# Patient Record
Sex: Female | Born: 1945
Health system: Southern US, Community
[De-identification: ages and names within clinical notes are randomized; demographics above are authoritative.]

## PROBLEM LIST (undated history)

## (undated) DIAGNOSIS — J449 Chronic obstructive pulmonary disease, unspecified: Secondary | ICD-10-CM

## (undated) DIAGNOSIS — I10 Essential (primary) hypertension: Secondary | ICD-10-CM

## (undated) DIAGNOSIS — R040 Epistaxis: Secondary | ICD-10-CM

## (undated) HISTORY — PX: BACK SURGERY: SHX140

## (undated) HISTORY — PX: ABDOMINAL HYSTERECTOMY: SHX81

---

## 2005-04-18 ENCOUNTER — Ambulatory Visit: Payer: Self-pay | Admitting: Internal Medicine

## 2005-06-04 ENCOUNTER — Ambulatory Visit: Payer: Self-pay | Admitting: Internal Medicine

## 2005-10-20 ENCOUNTER — Ambulatory Visit: Payer: Self-pay | Admitting: Internal Medicine

## 2006-07-30 ENCOUNTER — Ambulatory Visit: Payer: Self-pay | Admitting: Internal Medicine

## 2006-10-20 ENCOUNTER — Ambulatory Visit: Payer: Self-pay | Admitting: Internal Medicine

## 2007-08-24 ENCOUNTER — Ambulatory Visit: Payer: Self-pay | Admitting: Internal Medicine

## 2007-10-20 ENCOUNTER — Ambulatory Visit: Payer: Self-pay | Admitting: Internal Medicine

## 2008-08-25 ENCOUNTER — Ambulatory Visit: Payer: Self-pay | Admitting: Internal Medicine

## 2008-10-17 ENCOUNTER — Ambulatory Visit: Payer: Self-pay | Admitting: Internal Medicine

## 2009-08-06 ENCOUNTER — Ambulatory Visit: Payer: Self-pay | Admitting: Internal Medicine

## 2009-09-04 ENCOUNTER — Ambulatory Visit: Payer: Self-pay | Admitting: Internal Medicine

## 2009-10-17 ENCOUNTER — Ambulatory Visit: Payer: Self-pay | Admitting: Internal Medicine

## 2010-08-27 ENCOUNTER — Ambulatory Visit: Payer: Self-pay | Admitting: Internal Medicine

## 2010-09-17 ENCOUNTER — Ambulatory Visit: Payer: Self-pay | Admitting: Internal Medicine

## 2011-05-28 ENCOUNTER — Encounter: Payer: Self-pay | Admitting: Emergency Medicine

## 2011-05-28 ENCOUNTER — Emergency Department (HOSPITAL_COMMUNITY)
Admission: EM | Admit: 2011-05-28 | Discharge: 2011-05-28 | Disposition: A | Discharge status: Home / Self Care | Payer: 59 | Attending: Emergency Medicine | Admitting: Emergency Medicine

## 2011-05-28 DIAGNOSIS — Z79899 Other long term (current) drug therapy: Secondary | ICD-10-CM | POA: Insufficient documentation

## 2011-05-28 DIAGNOSIS — R040 Epistaxis: Secondary | ICD-10-CM | POA: Insufficient documentation

## 2011-05-28 DIAGNOSIS — J45909 Unspecified asthma, uncomplicated: Secondary | ICD-10-CM | POA: Insufficient documentation

## 2011-05-28 DIAGNOSIS — F17200 Nicotine dependence, unspecified, uncomplicated: Secondary | ICD-10-CM | POA: Insufficient documentation

## 2011-05-28 NOTE — ED Provider Notes (Signed)
History     CSN: [id number] Arrival date & time: 05/28/2011  5:08 AM   First MD Initiated Contact with Patient 05/28/11 [phone]      Chief Complaint  Patient presents with  . Epistaxis    (Consider location/radiation/quality/duration/timing/severity/associated sxs/prior treatment) Patient is a 65 y.o. female presenting with nosebleeds. The history is provided by the patient.  Epistaxis  This is a recurrent problem. The current episode started more than 2 days ago. The problem occurs rarely (He mostly has been occurring at night). The bleeding has been from the left nare. She has tried applying pressure for the symptoms. The treatment provided mild relief. Her past medical history does not include bleeding disorder, sinus problems or frequent nosebleeds.    Past Medical History  Diagnosis Date  . Asthma     Past Surgical History  Procedure Date  . Abdominal hysterectomy     History reviewed. No pertinent family history.  History  Substance Use Topics  . Smoking status: Current Everyday Smoker -- 1.0 packs/day    Types: Cigarettes  . Smokeless tobacco: Not on file  . Alcohol Use: 16.8 oz/week    28 Cans of beer per week    OB History    Grav Para Term Preterm Abortions TAB SAB Ect Mult Living                  Review of Systems  HENT: Positive for nosebleeds.   All other systems reviewed and are negative.    Allergies  Aspirin; Penicillins; and Sulfa antibiotics  Home Medications   Current Outpatient Rx  Name Route Sig Dispense Refill  . ALPRAZOLAM 0.25 MG PO TABS Oral Take 0.125 mg by mouth every morning. Once daily    . BECLOMETHASONE DIPROPIONATE 80 MCG/ACT IN AERS Inhalation Inhale 1 puff into the lungs as needed.      Marland Kitchen FLUTICASONE PROPIONATE 50 MCG/ACT NA SUSP Nasal Place 2 sprays into the nose daily.      Marland Kitchen FORMOTEROL FUMARATE 12 MCG IN CAPS Inhalation Place 12 mcg into inhaler and inhale daily.      Marland Kitchen HYPROMELLOSE 2.5 % OP SOLN  1 drop 3 (three) times  daily as needed. Dry eyes.     . IBUPROFEN 200 MG PO TABS Oral Take 200 mg by mouth every 6 (six) hours as needed. pain     . MONTELUKAST SODIUM 10 MG PO TABS Oral Take 10 mg by mouth at bedtime.        BP 187/90  Pulse 109  Temp(Src) 97.5 F (36.4 C) (Oral)  Resp 22  SpO2 93%  Physical Exam  Nursing note and vitals reviewed. Constitutional: She appears well-developed and well-nourished. No distress.  HENT:  Head: Normocephalic and atraumatic.  Right Ear: External ear normal.  Left Ear: External ear normal.  Nose: No nose lacerations, sinus tenderness, nasal deformity or septal deviation. Epistaxis is observed.       Pressure clot in the left nares, no obvious source of bleeding; right nostril normal  Eyes: Conjunctivae are normal. Right eye exhibits no discharge. Left eye exhibits no discharge. No scleral icterus.  Neck: Neck supple. No tracheal deviation present.  Cardiovascular: Normal rate.   Pulmonary/Chest: Effort normal. No stridor. No respiratory distress.  Musculoskeletal: She exhibits no edema.  Neurological: She is alert. Cranial nerve deficit: no gross deficits.  Skin: Skin is warm and dry. No rash noted.  Psychiatric: She has a normal mood and affect.    ED Course  EPISTAXIS MANAGEMENT Date/Time: 05/28/2011 5:52 AM Performed by: Linwood Dibbles R Authorized by: Linwood Dibbles R Consent: Verbal consent obtained. Risks and benefits: risks, benefits and alternatives were discussed Consent given by: patient Treatment site: left anterior Repair method: nasal balloon (5.5 cm Rhinostat) Treatment complexity: simple Patient tolerance: Patient tolerated the procedure well with no immediate complications.   (including critical care time)  Labs Reviewed - No data to display No results found.   1. Epistaxis       MDM  Patient with epistaxis. The bleeding was controlled with the nasal tampon. Patient tolerated procedure well. No further bleeding was  noted        Celene Kras, MD 05/28/11 [phone]

## 2011-05-28 NOTE — ED Notes (Signed)
Pt presents with nosebleed  Pt states it started 3 days ago and has been bleeding once a day mainly in the evening  Pt states this morning she got up to go to the restroom and it started bleeding  Pt has gauze in both nostrils at the present time

## 2011-05-28 NOTE — ED Notes (Signed)
MD at bedside. 

## 2011-05-28 NOTE — ED Notes (Signed)
Pt presents with nose bleed, pt placed paper towel in bilateral nares to stop the bleeding, MMM, bloody drainage noted in throat, tolerating secretions well, denies pain, cont to monitor

## 2011-05-28 NOTE — ED Notes (Signed)
Pt alert, c/o nose bleed, onset three days ago, worse this evening, resp even unlabored, skin pwd, tolerating oral secretions well, denies trauma or injury

## 2011-05-31 ENCOUNTER — Encounter (HOSPITAL_COMMUNITY): Payer: Self-pay

## 2011-05-31 ENCOUNTER — Emergency Department (HOSPITAL_COMMUNITY)
Admission: EM | Admit: 2011-05-31 | Discharge: 2011-05-31 | Disposition: A | Discharge status: Home / Self Care | Payer: 59 | Attending: Emergency Medicine | Admitting: Emergency Medicine

## 2011-05-31 DIAGNOSIS — I10 Essential (primary) hypertension: Secondary | ICD-10-CM | POA: Insufficient documentation

## 2011-05-31 DIAGNOSIS — J45909 Unspecified asthma, uncomplicated: Secondary | ICD-10-CM | POA: Insufficient documentation

## 2011-05-31 DIAGNOSIS — Z79899 Other long term (current) drug therapy: Secondary | ICD-10-CM | POA: Insufficient documentation

## 2011-05-31 DIAGNOSIS — F17200 Nicotine dependence, unspecified, uncomplicated: Secondary | ICD-10-CM | POA: Insufficient documentation

## 2011-05-31 HISTORY — DX: Epistaxis: R04.0

## 2011-05-31 MED ORDER — HYDROCHLOROTHIAZIDE 25 MG PO TABS: 12.5000 mg | ORAL_TABLET | Freq: Every day | ORAL | Status: DC

## 2011-05-31 NOTE — ED Provider Notes (Signed)
History     CSN: [id number] Arrival date & time: 05/31/2011  6:34 PM   First MD Initiated Contact with Patient 05/31/11 1921      Chief Complaint  Patient presents with  . Hypertension    (Consider location/radiation/quality/duration/timing/severity/associated sxs/prior treatment) Patient is a 65 y.o. female presenting with hypertension. The history is provided by the patient.  Hypertension   the patient reports developing a left nosebleed 4 days ago and since the recording high blood pressures at home.  She reports her blood pressure was high on arrival to the emergency department that day and since then she's continued to have high blood pressure readings at home.  She has no prior history of hypertension.  She reports her last followup appointment with her PCP was in February which time her blood pressure was normal.  She denies chest pain shortness of breath.  She denies current headache.  She denies upper lower external weakness.  Nothing worsens her symptoms.  Nothing improves her symptoms.  She is scheduled to have her left Rhino Rocket removed earlier this week..  She reports no more bleeding from her left neck  Past Medical History  Diagnosis Date  . Asthma   . Nosebleed     Past Surgical History  Procedure Date  . Abdominal hysterectomy     History reviewed. No pertinent family history.  History  Substance Use Topics  . Smoking status: Current Everyday Smoker -- 1.0 packs/day    Types: Cigarettes  . Smokeless tobacco: Not on file  . Alcohol Use: 16.8 oz/week    28 Cans of beer per week    OB History    Grav Para Term Preterm Abortions TAB SAB Ect Mult Living                  Review of Systems  All other systems reviewed and are negative.    Allergies  Aspirin; Penicillins; and Sulfa antibiotics  Home Medications   Current Outpatient Rx  Name Route Sig Dispense Refill  . ALPRAZOLAM 0.25 MG PO TABS Oral Take 0.125 mg by mouth every morning.     .  BECLOMETHASONE DIPROPIONATE 80 MCG/ACT IN AERS Inhalation Inhale 1 puff into the lungs daily.     Marland Kitchen FLUTICASONE PROPIONATE 50 MCG/ACT NA SUSP Nasal Place 2 sprays into the nose daily.      Marland Kitchen FORMOTEROL FUMARATE 12 MCG IN CAPS Inhalation Place 12 mcg into inhaler and inhale daily.      Marland Kitchen HYPROMELLOSE 2.5 % OP SOLN Both Eyes Place 1 drop into both eyes 3 (three) times daily as needed. For dry eyes.    . IBUPROFEN 200 MG PO TABS Oral Take 200 mg by mouth every 6 (six) hours as needed. For pain.    Marland Kitchen MONTELUKAST SODIUM 10 MG PO TABS Oral Take 10 mg by mouth at bedtime.      Marland Kitchen HYDROCHLOROTHIAZIDE 25 MG PO TABS Oral Take 0.5 tablets (12.5 mg total) by mouth daily. 30 tablet 0    BP 187/89  Pulse 85  Temp(Src) 97.9 F (36.6 C) (Oral)  Resp 16  Wt 144 lb (65.318 kg)  SpO2 96%  Physical Exam  Nursing note and vitals reviewed. Constitutional: She is oriented to person, place, and time. She appears well-developed and well-nourished. No distress.  HENT:  Head: Normocephalic and atraumatic.       Rhino Rocket and left there without surrounding bleeding  Eyes: EOM are normal.  Neck: Normal range of motion.  Cardiovascular: Normal rate, regular rhythm and normal heart sounds.   Pulmonary/Chest: Effort normal and breath sounds normal.  Abdominal: Soft. She exhibits no distension. There is no tenderness.  Musculoskeletal: Normal range of motion.  Neurological: She is alert and oriented to person, place, and time.  Skin: Skin is warm and dry.  Psychiatric: She has a normal mood and affect. Judgment normal.    ED Course  Procedures (including critical care time)  Labs Reviewed - No data to display No results found.   1. Hypertension       MDM  Isolated hypertension without symptoms.  The patient will be started on 12.5mg   daily hydrochlorothiazide.         Lyanne Co, MD 06/01/11 [phone]

## 2011-05-31 NOTE — ED Notes (Signed)
Seen here and treated for left nosebleed on Tuesday- pt was packed to left nare- Pt reports elevated BP no other symptoms at present- no active bleeding

## 2011-05-31 NOTE — ED Notes (Signed)
Patient stable upon discharge.  Patient states understanding of information md went over and dc papers.

## 2011-08-11 ENCOUNTER — Ambulatory Visit: Payer: Self-pay | Admitting: Internal Medicine

## 2011-09-01 ENCOUNTER — Ambulatory Visit: Payer: Self-pay | Admitting: Internal Medicine

## 2011-10-01 ENCOUNTER — Ambulatory Visit: Payer: Self-pay | Admitting: Internal Medicine

## 2011-11-27 ENCOUNTER — Encounter (HOSPITAL_COMMUNITY): Payer: Self-pay

## 2011-11-27 ENCOUNTER — Emergency Department (HOSPITAL_COMMUNITY): Payer: 59

## 2011-11-27 ENCOUNTER — Emergency Department (HOSPITAL_COMMUNITY)
Admission: EM | Admit: 2011-11-27 | Discharge: 2011-11-27 | Disposition: A | Discharge status: Home / Self Care | Payer: 59 | Attending: Emergency Medicine | Admitting: Emergency Medicine

## 2011-11-27 DIAGNOSIS — J45909 Unspecified asthma, uncomplicated: Secondary | ICD-10-CM | POA: Insufficient documentation

## 2011-11-27 DIAGNOSIS — R202 Paresthesia of skin: Secondary | ICD-10-CM

## 2011-11-27 DIAGNOSIS — R209 Unspecified disturbances of skin sensation: Secondary | ICD-10-CM | POA: Insufficient documentation

## 2011-11-27 DIAGNOSIS — F17200 Nicotine dependence, unspecified, uncomplicated: Secondary | ICD-10-CM | POA: Insufficient documentation

## 2011-11-27 DIAGNOSIS — I10 Essential (primary) hypertension: Secondary | ICD-10-CM | POA: Insufficient documentation

## 2011-11-27 DIAGNOSIS — I498 Other specified cardiac arrhythmias: Secondary | ICD-10-CM | POA: Insufficient documentation

## 2011-11-27 DIAGNOSIS — R42 Dizziness and giddiness: Secondary | ICD-10-CM | POA: Insufficient documentation

## 2011-11-27 DIAGNOSIS — J3489 Other specified disorders of nose and nasal sinuses: Secondary | ICD-10-CM | POA: Insufficient documentation

## 2011-11-27 HISTORY — DX: Essential (primary) hypertension: I10

## 2011-11-27 LAB — CBC
MCH: 34.7 pg — ABNORMAL HIGH (ref 26.0–34.0)
MCHC: 34.3 g/dL (ref 30.0–36.0)
MCV: 101.3 fL — ABNORMAL HIGH (ref 78.0–100.0)
Platelets: 291 10*3/uL (ref 150–400)
RBC: 4.78 MIL/uL (ref 3.87–5.11)

## 2011-11-27 LAB — POCT I-STAT, CHEM 8
BUN: 11 mg/dL (ref 6–23)
Calcium, Ion: 1.17 mmol/L (ref 1.12–1.32)
TCO2: 28 mmol/L (ref 0–100)

## 2011-11-27 LAB — POCT I-STAT TROPONIN I: Troponin i, poc: 0 ng/mL (ref 0.00–0.08)

## 2011-11-27 NOTE — ED Notes (Signed)
Patient reports that she has been having dizziness, feeling tired and night sweats since November 2012. Left arm numbness x 2 weeks. Patient was started on anti hypertension meds and dosages and meds  changed  Several times.

## 2011-11-27 NOTE — ED Provider Notes (Signed)
Medical screening examination/treatment/procedure(s) were performed by non-physician practitioner and as supervising physician I was immediately available for consultation/collaboration.   Dayton Bailiff, MD 11/27/11 1534

## 2011-11-27 NOTE — ED Provider Notes (Signed)
History     CSN: [id number]  Arrival date & time 11/27/11  1135   First MD Initiated Contact with Patient 11/27/11 1248      Chief Complaint  Patient presents with  . Dizziness  . Night Sweats    (Consider location/radiation/quality/duration/timing/severity/associated sxs/prior treatment) HPI Comments: Pt sis a 66 yo female who presents with complaints of dizziness, left arm tingling, weakness. States dizziness since she started blood pressure medications. States her doctor has changed her medications several times, each time her dizziness improves but recurs. States in the last two weeks left arm tingling sensation that comes and goes. Denies neck pain or injury. Denies left hand weakness. Nothing makes her symptom come on or go away.   The history is provided by the patient.    Past Medical History  Diagnosis Date  . Asthma   . Nosebleed   . Hypertension     Past Surgical History  Procedure Date  . Abdominal hysterectomy     Family History  Problem Relation Age of Onset  . Cancer Mother   . Diabetes Father     History  Substance Use Topics  . Smoking status: Current Everyday Smoker -- 1.0 packs/day    Types: Cigarettes  . Smokeless tobacco: Not on file  . Alcohol Use: 16.8 oz/week    28 Cans of beer per week    OB History    Grav Para Term Preterm Abortions TAB SAB Ect Mult Living                  Review of Systems  Constitutional: Negative for fever and chills.  HENT: Positive for congestion.   Respiratory: Negative for chest tightness and shortness of breath.   Cardiovascular: Negative for chest pain, palpitations and leg swelling.  Gastrointestinal: Negative for nausea and abdominal pain.  Genitourinary: Negative.   Musculoskeletal: Negative.   Skin: Negative.   Neurological: Positive for dizziness, weakness, light-headedness and numbness. Negative for headaches.    Allergies  Aspirin; Penicillins; and Sulfa antibiotics  Home Medications    Current Outpatient Rx  Name Route Sig Dispense Refill  . ALPRAZOLAM 0.25 MG PO TABS Oral Take 0.125 mg by mouth every morning.     . BECLOMETHASONE DIPROPIONATE 80 MCG/ACT IN AERS Inhalation Inhale 1 puff into the lungs daily.     Marland Kitchen BISOPROLOL FUMARATE 5 MG PO TABS Oral Take 5 mg by mouth daily.    Marland Kitchen ESOMEPRAZOLE MAGNESIUM 40 MG PO CPDR Oral Take 40 mg by mouth daily before breakfast.    . FLUTICASONE PROPIONATE 50 MCG/ACT NA SUSP Nasal Place 2 sprays into the nose daily.      Marland Kitchen FORMOTEROL FUMARATE 12 MCG IN CAPS Inhalation Place 12 mcg into inhaler and inhale daily.      Marland Kitchen HYPROMELLOSE 2.5 % OP SOLN Both Eyes Place 1 drop into both eyes 3 (three) times daily as needed. For dry eyes.    . IBUPROFEN 200 MG PO TABS Oral Take 200 mg by mouth every 6 (six) hours as needed. For pain.    Marland Kitchen MONTELUKAST SODIUM 10 MG PO TABS Oral Take 10 mg by mouth at bedtime.      Marland Kitchen TIOTROPIUM BROMIDE MONOHYDRATE 18 MCG IN CAPS Inhalation Place 18 mcg into inhaler and inhale daily.      BP 151/79  Pulse 69  Temp(Src) 98 F (36.7 C) (Oral)  Resp 20  Ht 5' 4.5" (1.638 m)  Wt 130 lb 4 oz (59.081 kg)  BMI  22.01 kg/m2  SpO2 98%  Physical Exam  Nursing note and vitals reviewed. Constitutional: She is oriented to person, place, and time. She appears well-developed and well-nourished. No distress.  HENT:  Head: Normocephalic.       Nasal congestion  Eyes: Conjunctivae are normal.  Neck: Normal range of motion. Neck supple.  Cardiovascular: Normal rate, regular rhythm and normal heart sounds.   Pulmonary/Chest: Effort normal and breath sounds normal. No respiratory distress. She has no wheezes.  Abdominal: Soft. Bowel sounds are normal. She exhibits no distension. There is no tenderness.  Musculoskeletal: Normal range of motion. She exhibits no edema.  Neurological: She is alert and oriented to person, place, and time. No cranial nerve deficit. Coordination normal.       Grip strength equal bilaterally.   Normal sensation over bilateral hands.   Skin: Skin is warm and dry.  Psychiatric: She has a normal mood and affect.    ED Course  Procedures (including critical care time) Pt in NAD. No current CP. No no dizziness currently. Pt does sound nasally congested, seeing allergist.  Possible inner ear vertigo? Pt worried about MI. Will get Labs, troponin, ecg, cxr   Date: 11/27/2011  Rate: 58  Rhythm: sinus bradycardia  QRS Axis: normal  Intervals: normal  ST/T Wave abnormalities: nonspecific T wave changes  Conduction Disutrbances:none  Narrative Interpretation:   Old EKG Reviewed: none available   Results for orders placed during the hospital encounter of 11/27/11  CBC      Component Value Range   WBC 8.9  4.0 - 10.5 (K/uL)   RBC 4.78  3.87 - 5.11 (MIL/uL)   Hemoglobin 16.6 (*) 12.0 - 15.0 (g/dL)   HCT 16.1 (*) 09.6 - 46.0 (%)   MCV 101.3 (*) 78.0 - 100.0 (fL)   MCH 34.7 (*) 26.0 - 34.0 (pg)   MCHC 34.3  30.0 - 36.0 (g/dL)   RDW 04.5  40.9 - 81.1 (%)   Platelets 291  150 - 400 (K/uL)  POCT I-STAT, CHEM 8      Component Value Range   Sodium 135  135 - 145 (mEq/L)   Potassium 3.9  3.5 - 5.1 (mEq/L)   Chloride 97  96 - 112 (mEq/L)   BUN 11  6 - 23 (mg/dL)   Creatinine, Ser 9.14  0.50 - 1.10 (mg/dL)   Glucose, Bld 782 (*) 70 - 99 (mg/dL)   Calcium, Ion 9.56  2.13 - 1.32 (mmol/L)   TCO2 28  0 - 100 (mmol/L)   Hemoglobin 18.0 (*) 12.0 - 15.0 (g/dL)   HCT 08.6 (*) 57.8 - 46.0 (%)  POCT I-STAT TROPONIN I      Component Value Range   Troponin i, poc 0.00  0.00 - 0.08 (ng/mL)   Comment 3             3:06 PM Pt's labs and CXR unremarkable. Troponin negative. Pt in no distress. Spulings test negative. No neuro deficits. Normal strength of extremities. VS normal.   Will d/c home.   1. Dizziness   2. Paresthesia of left arm       MDM          Lottie Mussel, PA 11/27/11 1508  Lottie Mussel, [place] 11/27/11 [phone]

## 2011-11-27 NOTE — ED Notes (Signed)
Dizziness, night sweats, left arm cold x 2 weeks.

## 2011-11-27 NOTE — Discharge Instructions (Signed)
Your lab work and x-ray did not show any results that would cause your symptoms. Do not check your blood pressure too often. Follow up with your doctor in the office for recheck, further evaluation and treatment. Return if worsening.   Dizziness Dizziness is a common problem. It is a feeling of unsteadiness or lightheadedness. You may feel like you are about to faint. Dizziness can lead to injury if you stumble or fall. A person of any age group can suffer from dizziness, but dizziness is more common in older adults. CAUSES  Dizziness can be caused by many different things, including:  Middle ear problems.   Standing for too long.   Infections.   An allergic reaction.   Aging.   An emotional response to something, such as the sight of blood.   Side effects of medicines.   Fatigue.   Problems with circulation or blood pressure.   Excess use of alcohol, medicines, or illegal drug use.   Breathing too fast (hyperventilation).   An arrhythmia or problems with your heart rhythm.   Low red blood cell count (anemia).   Pregnancy.   Vomiting, diarrhea, fever, or other illnesses that cause dehydration.   Diseases or conditions such as Parkinson's disease, high blood pressure (hypertension), diabetes, and thyroid problems.   Exposure to extreme heat.  DIAGNOSIS  To find the cause of your dizziness, your caregiver may do a physical exam, lab tests, radiologic imaging scans, or an electrocardiography test (ECG).  TREATMENT  Treatment of dizziness depends on the cause of your symptoms and can vary greatly. HOME CARE INSTRUCTIONS   Drink enough fluids to keep your urine clear or pale yellow. This is especially important in very hot weather. In the elderly, it is also important in cold weather.   If your dizziness is caused by medicines, take them exactly as directed. When taking blood pressure medicines, it is especially important to get up slowly.   Rise slowly from chairs and  steady yourself until you feel okay.   In the morning, first sit up on the side of the bed. When this seems okay, stand slowly while holding onto something until you know your balance is fine.   If you need to stand in one place for a long time, be sure to move your legs often. Tighten and relax the muscles in your legs while standing.   If dizziness continues to be a problem, have someone stay with you for a day or two. Do this until you feel you are well enough to stay alone. Have the person call your caregiver if he or she notices changes in you that are concerning.   Do not drive or use heavy machinery if you feel dizzy.  SEEK IMMEDIATE MEDICAL CARE IF:   Your dizziness or lightheadedness gets worse.   You feel nauseous or vomit.   You develop problems with talking, walking, weakness, or using your arms, hands, or legs.   You are not thinking clearly or you have difficulty forming sentences. It may take a friend or family member to determine if your thinking is normal.   You develop chest pain, abdominal pain, shortness of breath, or sweating.   Your vision changes.   You notice any bleeding.   You have side effects from medicine that seems to be getting worse rather than better.  MAKE SURE YOU:   Understand these instructions.   Will watch your condition.   Will get help right away if you are  not doing well or get worse.  Document Released: 12/17/2000 Document Revised: 06/12/2011 Document Reviewed: 01/10/2011 [facility] Patient Information 2012 [place], [place].  Hypertension As your heart beats, it forces blood through your arteries. This force is your blood pressure. If the pressure is too high, it is called hypertension (HTN) or high blood pressure. HTN is dangerous because you may have it and not know it. High blood pressure may mean that your heart has to work harder to pump blood. Your arteries may be narrow or stiff. The extra work puts you at risk for heart disease,  stroke, and other problems.  Blood pressure consists of two numbers, a higher number over a lower, 110/72, for example. It is stated as "110 over 72." The ideal is below 120 for the top number (systolic) and under 80 for the bottom (diastolic). Write down your blood pressure today. You should pay close attention to your blood pressure if you have certain conditions such as:  Heart failure.   Prior heart attack.   Diabetes   Chronic kidney disease.   Prior stroke.   Multiple risk factors for heart disease.  To see if you have HTN, your blood pressure should be measured while you are seated with your arm held at the level of the heart. It should be measured at least twice. A one-time elevated blood pressure reading (especially in the Emergency Department) does not mean that you need treatment. There may be conditions in which the blood pressure is different between your right and left arms. It is important to see your caregiver soon for a recheck. Most people have essential hypertension which means that there is not a specific cause. This type of high blood pressure may be lowered by changing lifestyle factors such as:  Stress.   Smoking.   Lack of exercise.   Excessive weight.   Drug/tobacco/alcohol use.   Eating less salt.  Most people do not have symptoms from high blood pressure until it has caused damage to the body. Effective treatment can often prevent, delay or reduce that damage. TREATMENT  When a cause has been identified, treatment for high blood pressure is directed at the cause. There are a large number of medications to treat HTN. These fall into several categories, and your caregiver will help you select the medicines that are best for you. Medications may have side effects. You should review side effects with your caregiver. If your blood pressure stays high after you have made lifestyle changes or started on medicines,   Your medication(s) may need to be changed.    Other problems may need to be addressed.   Be certain you understand your prescriptions, and know how and when to take your medicine.   Be sure to follow up with your caregiver within the time frame advised (usually within two weeks) to have your blood pressure rechecked and to review your medications.   If you are taking more than one medicine to lower your blood pressure, make sure you know how and at what times they should be taken. Taking two medicines at the same time can result in blood pressure that is too low.  SEEK IMMEDIATE MEDICAL CARE IF:  You develop a severe headache, blurred or changing vision, or confusion.   You have unusual weakness or numbness, or a faint feeling.   You have severe chest or abdominal pain, vomiting, or breathing problems.  MAKE SURE YOU:   Understand these instructions.   Will watch your condition.  Will get help right away if you are not doing well or get worse.  Document Released: 06/23/2005 Document Revised: 06/12/2011 Document Reviewed: 02/11/2008 [facility] Patient Information 2012 [place], [place].

## 2011-12-02 ENCOUNTER — Ambulatory Visit: Payer: Self-pay | Admitting: Specialist

## 2011-12-02 LAB — CREATININE, SERUM
EGFR (African American): >60
EGFR (Non-African Amer.): >60

## 2012-05-24 ENCOUNTER — Emergency Department (HOSPITAL_COMMUNITY): Payer: 59

## 2012-05-24 ENCOUNTER — Encounter (HOSPITAL_COMMUNITY): Payer: Self-pay | Admitting: *Deleted

## 2012-05-24 ENCOUNTER — Emergency Department (HOSPITAL_COMMUNITY)
Admission: EM | Admit: 2012-05-24 | Discharge: 2012-05-24 | Disposition: A | Discharge status: Home / Self Care | Payer: 59 | Attending: Emergency Medicine | Admitting: Emergency Medicine

## 2012-05-24 DIAGNOSIS — R0789 Other chest pain: Secondary | ICD-10-CM

## 2012-05-24 DIAGNOSIS — R05 Cough: Secondary | ICD-10-CM | POA: Insufficient documentation

## 2012-05-24 DIAGNOSIS — R059 Cough, unspecified: Secondary | ICD-10-CM | POA: Insufficient documentation

## 2012-05-24 DIAGNOSIS — Z79899 Other long term (current) drug therapy: Secondary | ICD-10-CM | POA: Insufficient documentation

## 2012-05-24 DIAGNOSIS — F17200 Nicotine dependence, unspecified, uncomplicated: Secondary | ICD-10-CM | POA: Insufficient documentation

## 2012-05-24 DIAGNOSIS — J4489 Other specified chronic obstructive pulmonary disease: Secondary | ICD-10-CM | POA: Insufficient documentation

## 2012-05-24 DIAGNOSIS — R071 Chest pain on breathing: Secondary | ICD-10-CM | POA: Insufficient documentation

## 2012-05-24 DIAGNOSIS — I10 Essential (primary) hypertension: Secondary | ICD-10-CM | POA: Insufficient documentation

## 2012-05-24 DIAGNOSIS — Z8669 Personal history of other diseases of the nervous system and sense organs: Secondary | ICD-10-CM | POA: Insufficient documentation

## 2012-05-24 DIAGNOSIS — J449 Chronic obstructive pulmonary disease, unspecified: Secondary | ICD-10-CM | POA: Insufficient documentation

## 2012-05-24 HISTORY — DX: Chronic obstructive pulmonary disease, unspecified: J44.9

## 2012-05-24 LAB — COMPREHENSIVE METABOLIC PANEL
Albumin: 4.4 g/dL (ref 3.5–5.2)
BUN: 7 mg/dL (ref 6–23)
Calcium: 9.7 mg/dL (ref 8.4–10.5)
Creatinine, Ser: 0.46 mg/dL — ABNORMAL LOW (ref 0.50–1.10)
Total Protein: 8.2 g/dL (ref 6.0–8.3)

## 2012-05-24 LAB — CBC WITH DIFFERENTIAL/PLATELET
Basophils Relative: 0 % (ref 0–1)
Eosinophils Absolute: 0 10*3/uL (ref 0.0–0.7)
HCT: 50.9 % — ABNORMAL HIGH (ref 36.0–46.0)
Hemoglobin: 18.2 g/dL — ABNORMAL HIGH (ref 12.0–15.0)
MCH: 34.9 pg — ABNORMAL HIGH (ref 26.0–34.0)
MCHC: 35.8 g/dL (ref 30.0–36.0)
Monocytes Absolute: 0.7 10*3/uL (ref 0.1–1.0)
Monocytes Relative: 9 % (ref 3–12)

## 2012-05-24 LAB — POCT I-STAT TROPONIN I: Troponin i, poc: 0 ng/mL (ref 0.00–0.08)

## 2012-05-24 MED ORDER — OXYCODONE-ACETAMINOPHEN 5-325 MG PO TABS: 1.0000 | ORAL_TABLET | Freq: Four times a day (QID) | ORAL | Status: DC | PRN

## 2012-05-24 MED ORDER — IBUPROFEN 200 MG PO TABS
600.0000 mg | ORAL_TABLET | Freq: Once | ORAL | Status: AC
  Administered 2012-05-24: 600 mg via ORAL
  Filled 2012-05-24: qty 3

## 2012-05-24 MED ORDER — IOHEXOL 350 MG/ML SOLN
100.0000 mL | Freq: Once | INTRAVENOUS | Status: AC | PRN
  Administered 2012-05-24: 100 mL via INTRAVENOUS

## 2012-05-24 MED ORDER — SODIUM CHLORIDE 0.9 % IV BOLUS (SEPSIS)
1000.0000 mL | Freq: Once | INTRAVENOUS | Status: AC
  Administered 2012-05-24: 1000 mL via INTRAVENOUS

## 2012-05-24 NOTE — ED Notes (Signed)
Pt reports chest pain since Friday, located in L breast/rib area. Describes as an ache. Sts pain worsens and moves into back when lying on side. Emesis x1 this am. Nausea intermittently. "little bit" of dizziness.

## 2012-05-24 NOTE — ED Notes (Signed)
Patient given discharge instructions, information, prescriptions, and diet order. Patient states that they adequately understand discharge information given and to return to ED if symptoms return or worsen.     

## 2012-05-24 NOTE — ED Provider Notes (Signed)
History     CSN: [id number]  Arrival date & time 05/24/12  1317   First MD Initiated Contact with Patient 05/24/12 1409      Chief Complaint  Patient presents with  . Chest Pain    (Consider location/radiation/quality/duration/timing/severity/associated sxs/prior treatment) Patient is a 66 y.o. female presenting with chest pain. The history is provided by the patient.  Chest Pain The chest pain began 3 - 5 days ago. Chest pain occurs constantly. The chest pain is unchanged. Associated with: worse with deep breathing or lying down on the left side. At its most intense, the pain is at 7/10. The pain is currently at 7/10. The severity of the pain is moderate. The quality of the pain is described as pleuritic and sharp. The pain does not radiate. Primary symptoms include cough. Pertinent negatives for primary symptoms include no shortness of breath, no wheezing, no palpitations, no nausea and no vomiting.  Pertinent negatives for associated symptoms include no diaphoresis. She tried nothing for the symptoms.  Her past medical history is significant for COPD and hypertension.  Pertinent negatives for past medical history include no CAD, no diabetes, no hyperlipidemia and no MI.  Pertinent negatives for family medical history include: no CAD in family.     Past Medical History  Diagnosis Date  . Asthma   . Nosebleed   . Hypertension   . COPD (chronic obstructive pulmonary disease)     Past Surgical History  Procedure Date  . Abdominal hysterectomy     Family History  Problem Relation Age of Onset  . Cancer Mother   . Diabetes Father     History  Substance Use Topics  . Smoking status: Current Every Day Smoker -- 1.0 packs/day    Types: Cigarettes  . Smokeless tobacco: Not on file  . Alcohol Use: 16.8 oz/week    28 Cans of beer per week     Comment: "at least of couple of beers every day"    OB History    Grav Para Term Preterm Abortions TAB SAB Ect Mult Living              Review of Systems  Constitutional: Negative for diaphoresis.  Respiratory: Positive for cough. Negative for shortness of breath and wheezing.   Cardiovascular: Positive for chest pain. Negative for palpitations.  Gastrointestinal: Negative for nausea and vomiting.  All other systems reviewed and are negative.    Allergies  Aspirin; Penicillins; and Sulfa antibiotics  Home Medications   Current Outpatient Rx  Name  Route  Sig  Dispense  Refill  . ALBUTEROL SULFATE HFA 108 (90 BASE) MCG/ACT IN AERS   Inhalation   Inhale 2 puffs into the lungs every 6 (six) hours as needed. For shortness of breath.         . ALPRAZOLAM 0.25 MG PO TABS   Oral   Take 0.125 mg by mouth every morning.          . BECLOMETHASONE DIPROPIONATE 80 MCG/ACT IN AERS   Inhalation   Inhale 1 puff into the lungs 2 (two) times daily.          Marland Kitchen BISOPROLOL FUMARATE 5 MG PO TABS   Oral   Take 2.5 mg by mouth daily.          Marland Kitchen ESOMEPRAZOLE MAGNESIUM 40 MG PO CPDR   Oral   Take 40 mg by mouth every morning.          Marland Kitchen FLUTICASONE PROPIONATE 50 MCG/ACT  NA SUSP   Nasal   Place 2 sprays into the nose daily as needed. For nasal congestion.         Marland Kitchen FORMOTEROL FUMARATE 12 MCG IN CAPS   Inhalation   Place 12 mcg into inhaler and inhale 2 (two) times daily.          . IBUPROFEN 200 MG PO TABS   Oral   Take 200-400 mg by mouth every 6 (six) hours as needed. For pain.         Marland Kitchen MONTELUKAST SODIUM 10 MG PO TABS   Oral   Take 10 mg by mouth at bedtime.           . ADULT MULTIVITAMIN W/MINERALS CH   Oral   Take 1 tablet by mouth daily. One-A-Day Chewable         . SALINE NASAL SPRAY 0.65 % NA SOLN   Nasal   Place 1 spray into the nose as needed. For nasal congestion.         Marland Kitchen TIOTROPIUM BROMIDE MONOHYDRATE 18 MCG IN CAPS   Inhalation   Place 18 mcg into inhaler and inhale daily.           BP 150/83  Temp 98.1 F (36.7 C) (Oral)  SpO2 93%  Physical Exam    Nursing note and vitals reviewed. Constitutional: She is oriented to person, place, and time. She appears well-developed and well-nourished. No distress.  HENT:  Head: Normocephalic and atraumatic.  Mouth/Throat: Oropharynx is clear and moist.  Eyes: Conjunctivae normal and EOM are normal. Pupils are equal, round, and reactive to light.  Neck: Normal range of motion. Neck supple.  Cardiovascular: Normal rate, regular rhythm and intact distal pulses.   No murmur heard. Pulmonary/Chest: Effort normal and breath sounds normal. No respiratory distress. She has no wheezes. She has no rales.   She exhibits tenderness.  Abdominal: Soft. She exhibits no distension. There is no tenderness. There is no rebound and no guarding.  Musculoskeletal: Normal range of motion. She exhibits no edema and no tenderness.  Neurological: She is alert and oriented to person, place, and time.  Skin: Skin is warm and dry. No rash noted. No erythema.  Psychiatric: She has a normal mood and affect. Her behavior is normal.    ED Course  Procedures (including critical care time)  Labs Reviewed  CBC WITH DIFFERENTIAL - Abnormal; Notable for the following:    RBC 5.22 (*)     Hemoglobin 18.2 (*)     HCT 50.9 (*)     MCH 34.9 (*)     All other components within normal limits  COMPREHENSIVE METABOLIC PANEL - Abnormal; Notable for the following:    Sodium 134 (*)     Chloride 95 (*)     Glucose, Bld 112 (*)     Creatinine, Ser 0.46 (*)     Alkaline Phosphatase 131 (*)     All other components within normal limits  POCT I-STAT TROPONIN I   Dg Chest 2 View  05/24/2012  *RADIOLOGY REPORT*  Clinical Data: Chest pain, cough  CHEST - 2 VIEW  Comparison: 11/27/2011  Findings: Small peripheral right upper lobe calcified granuloma noted, stable. Additional calcified granuloma in the left costophrenic angle.  Normal heart size and vascularity.  Negative for pneumonia, edema, collapse, consolidation, effusion, or  pneumothorax.  Trachea midline.  IMPRESSION: No acute chest process.  Stable granulomatous disease.   Original Report Authenticated By: Judie Petit. Miles Costain, M.D.  Ct Angio Chest Pe W/cm &/or Wo Cm  05/24/2012  *RADIOLOGY REPORT*  Clinical Data: Chest pain, shortness of breath.  CT ANGIOGRAPHY CHEST  Technique:  Multidetector CT imaging of the chest using the standard protocol during bolus administration of intravenous contrast. Multiplanar reconstructed images including MIPs were obtained and reviewed to evaluate the vascular anatomy.  Contrast: OMNIPAQUE IOHEXOL 350 MG/ML SOLN  Comparison: None.  Findings: There is good contrast opacification of the pulmonary artery branches.  No discrete filling defect to suggest acute PE.Adequate contrast opacification of the thoracic aorta with no evidence of dissection, aneurysm, or stenosis. There is classic 3- vessel brachiocephalic arch anatomy.  Patchy coronary and aortic calcified plaque.  No hilar or mediastinal adenopathy.  No pleural or pericardial effusion.  Mild emphysematous changes noted in the upper lobes.  Calcified granuloma in the superior segment right lower lobe.  Calcified right hilar lymph nodes.  Thoracic spine and sternum intact.  IMPRESSION: 1.  Negative for acute PE or thoracic aortic dissection. 2.  Atherosclerosis, including thoracic aortic and coronary artery disease. Please note that although the presence of coronary artery calcium documents the presence of coronary artery disease, the severity of this disease and any potential stenosis cannot be assessed on this non-gated CT examination.  Assessment for potential risk factor modification, dietary therapy or pharmacologic therapy may be warranted, if clinically indicated. 3.  Old granulomatous disease.   Original Report Authenticated By: D. Andria Rhein, MD      Date: 05/24/2012  Rate: 97  Rhythm: normal sinus rhythm  QRS Axis: right  Intervals: normal  ST/T Wave abnormalities: normal   Conduction Disutrbances:none  Narrative Interpretation:   Old EKG Reviewed: unchanged   No diagnosis found.    MDM  Pt with atypical story for CP.  TIMI 0 and only risk factor was smoking and HTN.  Pt's sx have been ongoing for the last 3 days without improvement and neg EKG and normal troponin today. Low suspicion for cardiac etiology.  No findings concerning for pericarditis.  Concern for possible PE given pleuritic sx.  CXR, CBC, BMP, CE  Are neg.  CTA of the chest pending.  4:01 PM CT of the chest neg for PE.  Feel this is most likely pleurisy.  No other acute findings currently.  Will d/c home with pain meds and f/u with PCP.       Gwyneth Sprout, MD 05/24/12 [phone]

## 2012-07-16 ENCOUNTER — Ambulatory Visit: Payer: Self-pay | Admitting: Internal Medicine

## 2012-07-22 ENCOUNTER — Ambulatory Visit: Payer: Self-pay | Admitting: Orthopedic Surgery

## 2012-07-23 LAB — PATHOLOGY REPORT

## 2012-11-16 ENCOUNTER — Ambulatory Visit: Payer: Self-pay | Admitting: Internal Medicine

## 2013-11-30 ENCOUNTER — Other Ambulatory Visit: Payer: Self-pay

## 2013-11-30 DIAGNOSIS — Z1231 Encounter for screening mammogram for malignant neoplasm of breast: Secondary | ICD-10-CM

## 2013-12-16 ENCOUNTER — Ambulatory Visit
Admission: RE | Admit: 2013-12-16 | Discharge: 2013-12-16 | Disposition: A | Discharge status: Home / Self Care | Payer: Medicare Other | Source: Ambulatory Visit

## 2013-12-16 DIAGNOSIS — Z1231 Encounter for screening mammogram for malignant neoplasm of breast: Secondary | ICD-10-CM

## 2014-08-23 ENCOUNTER — Other Ambulatory Visit: Payer: Self-pay | Admitting: Plastic Surgery

## 2014-08-23 DIAGNOSIS — C069 Malignant neoplasm of mouth, unspecified: Secondary | ICD-10-CM

## 2014-08-24 ENCOUNTER — Ambulatory Visit
Admission: RE | Admit: 2014-08-24 | Discharge: 2014-08-24 | Disposition: A | Discharge status: Home / Self Care | Payer: Medicare Other | Source: Ambulatory Visit | Attending: Plastic Surgery | Admitting: Plastic Surgery

## 2014-08-24 DIAGNOSIS — C069 Malignant neoplasm of mouth, unspecified: Secondary | ICD-10-CM

## 2014-08-24 MED ORDER — IOHEXOL 300 MG/ML  SOLN
75.0000 mL | Freq: Once | INTRAMUSCULAR | Status: AC | PRN
  Administered 2014-08-24: 75 mL via INTRAVENOUS

## 2014-10-27 NOTE — Op Note (Signed)
PATIENT NAME:  Deanna Burns, Deanna Burns MR#:  [id number] DATE OF BIRTH:  31-Dec-1945  DATE OF PROCEDURE:  07/22/2012  PREOPERATIVE DIAGNOSIS: T5 and T6 compression fractures.   POSTOPERATIVE DIAGNOSIS:  T5 and T6 compression fractures.   PROCEDURE: T5 and C6 a vertebral biopsy and kyphoplasty.   SURGEON: Hessie Knows, M.D.   ANESTHESIA: General.    DESCRIPTION OF PROCEDURE: Patient was brought to the operating room and after adequate anesthesia was obtained with sedation with the patient in a prone position, C-arm was brought in and good visualization of the affected levels could be obtained. A timeout  procedure was carried out and 20 mL of 1% Xylocaine was infiltrated into the area of the planned skin incisions. Next, the back was prepped and draped in the usual sterile fashion and a repeat timeout procedure completed. At this point a spinal needle was placed down to the appropriate level  over the rib and at the T5 level and T6 level on the right and left sides, with a mixture of Xylocaine and Marcaine being placed. After this had been completed, the T5 was approached first with a small stab incision, trocar introduced extra particularly and with care being taken to visualize this as it passed into the vertebral body in both AP and lateral projections. Biopsy was obtained with a good bone biopsy obtained. It did not appear pathologic. Next, the balloon was inserted and inflated approximately 1.5 mL of contrast,  elevating the fracture. The left side was addressed in the same fashion, except for no bone biopsy being obtained. At this point, the T6 level was addressed, making a stab incision on the right side. The trocar was advanced through the pedicle and actually into more of a center position and it was felt after that inflating the balloon only one level would be necessary for correction. The balloons were deflated at this time and the cement mixed and inserted through the cannulas, with care being taken to  make sure it did not migrate posteriorly or anteriorly. It did appear to stay within the vertebral bodies. After this had set adequately, all the trocars were removed. The wounds were cut closed with Dermabond and Band-Aids. A biopsy was also obtained of the T6 level after initially entering into it with a good bone biopsy obtained. Again, they did not appear to be pathologic.   ESTIMATED BLOOD LOSS: 25 mL.   COMPLICATIONS: None.   SPECIMEN: T5 and T6 vertebral body biopsies.    ____________________________ Laurene Footman, MD mjm:cc D: 07/22/2012 22:01:33 ET T: 07/22/2012 22:25:44 ET JOB#: [id number]  cc: Laurene Footman, MD, <Dictator> Laurene Footman MD ELECTRONICALLY SIGNED 07/23/2012 13:05

## 2014-11-15 ENCOUNTER — Other Ambulatory Visit: Payer: Self-pay

## 2014-11-15 DIAGNOSIS — Z1231 Encounter for screening mammogram for malignant neoplasm of breast: Secondary | ICD-10-CM

## 2014-12-12 ENCOUNTER — Other Ambulatory Visit: Payer: Self-pay | Admitting: Surgery

## 2014-12-12 DIAGNOSIS — M25512 Pain in left shoulder: Secondary | ICD-10-CM

## 2014-12-12 DIAGNOSIS — M7582 Other shoulder lesions, left shoulder: Secondary | ICD-10-CM

## 2014-12-20 ENCOUNTER — Ambulatory Visit
Admission: RE | Admit: 2014-12-20 | Discharge: 2014-12-20 | Disposition: A | Discharge status: Home / Self Care | Payer: Medicare Other | Source: Ambulatory Visit

## 2014-12-20 DIAGNOSIS — Z1231 Encounter for screening mammogram for malignant neoplasm of breast: Secondary | ICD-10-CM

## 2017-02-24 ENCOUNTER — Emergency Department (HOSPITAL_COMMUNITY)
Admission: EM | Admit: 2017-02-24 | Discharge: 2017-02-24 | Disposition: A | Discharge status: Home / Self Care | Payer: Medicare Other | Attending: Emergency Medicine | Admitting: Emergency Medicine

## 2017-02-24 ENCOUNTER — Encounter (HOSPITAL_COMMUNITY): Payer: Self-pay | Admitting: Emergency Medicine

## 2017-02-24 DIAGNOSIS — Z5321 Procedure and treatment not carried out due to patient leaving prior to being seen by health care provider: Secondary | ICD-10-CM | POA: Insufficient documentation

## 2017-02-24 LAB — COMPREHENSIVE METABOLIC PANEL
ALBUMIN: 4 g/dL (ref 3.5–5.0)
ALT: 13 U/L — ABNORMAL LOW (ref 14–54)
ANION GAP: 8 (ref 5–15)
AST: 17 U/L (ref 15–41)
Alkaline Phosphatase: 101 U/L (ref 38–126)
BUN: 12 mg/dL (ref 6–20)
CO2: 28 mmol/L (ref 22–32)
Calcium: 8.8 mg/dL — ABNORMAL LOW (ref 8.9–10.3)
Chloride: 92 mmol/L — ABNORMAL LOW (ref 101–111)
Creatinine, Ser: 0.71 mg/dL (ref 0.44–1.00)
GFR calc Af Amer: >60 mL/min (ref >60)
GFR calc non Af Amer: >60 mL/min (ref >60)
GLUCOSE: 124 mg/dL — AB (ref 65–99)
POTASSIUM: 4.4 mmol/L (ref 3.5–5.1)
SODIUM: 128 mmol/L — AB (ref 135–145)
Total Bilirubin: 1 mg/dL (ref 0.3–1.2)
Total Protein: 7.7 g/dL (ref 6.5–8.1)

## 2017-02-24 LAB — CBC
HCT: 47.3 % — ABNORMAL HIGH (ref 36.0–46.0)
HEMOGLOBIN: 16.4 g/dL — AB (ref 12.0–15.0)
MCH: 34 pg (ref 26.0–34.0)
MCHC: 34.7 g/dL (ref 30.0–36.0)
MCV: 98.1 fL (ref 78.0–100.0)
Platelets: 263 10*3/uL (ref 150–400)
RBC: 4.82 MIL/uL (ref 3.87–5.11)
RDW: 13.3 % (ref 11.5–15.5)
WBC: 10.2 10*3/uL (ref 4.0–10.5)

## 2017-02-24 LAB — LIPASE, BLOOD: LIPASE: 66 U/L — AB (ref 11–51)

## 2017-02-24 NOTE — ED Notes (Signed)
Pt gave registration her stickers and she left.

## 2017-02-24 NOTE — ED Triage Notes (Signed)
Patient c/o abd pain x 5 days. Patient took mag citrate yesterday to help have BM to see if would help with abd pain and gas pains. Patient reports that had "spurts of Bms that have been liquid and formed".  Patient reports nausea and loss of appetite.

## 2017-02-25 ENCOUNTER — Emergency Department (HOSPITAL_BASED_OUTPATIENT_CLINIC_OR_DEPARTMENT_OTHER): Payer: Medicare Other

## 2017-02-25 ENCOUNTER — Emergency Department (HOSPITAL_BASED_OUTPATIENT_CLINIC_OR_DEPARTMENT_OTHER)
Admission: EM | Admit: 2017-02-25 | Discharge: 2017-02-25 | Disposition: A | Discharge status: Home / Self Care | Payer: Medicare Other | Attending: Emergency Medicine | Admitting: Emergency Medicine

## 2017-02-25 ENCOUNTER — Encounter (HOSPITAL_BASED_OUTPATIENT_CLINIC_OR_DEPARTMENT_OTHER): Payer: Self-pay

## 2017-02-25 DIAGNOSIS — F17210 Nicotine dependence, cigarettes, uncomplicated: Secondary | ICD-10-CM | POA: Diagnosis not present

## 2017-02-25 DIAGNOSIS — I10 Essential (primary) hypertension: Secondary | ICD-10-CM | POA: Diagnosis not present

## 2017-02-25 DIAGNOSIS — J45909 Unspecified asthma, uncomplicated: Secondary | ICD-10-CM | POA: Diagnosis not present

## 2017-02-25 DIAGNOSIS — R1030 Lower abdominal pain, unspecified: Secondary | ICD-10-CM | POA: Diagnosis present

## 2017-02-25 DIAGNOSIS — K5792 Diverticulitis of intestine, part unspecified, without perforation or abscess without bleeding: Secondary | ICD-10-CM

## 2017-02-25 DIAGNOSIS — J449 Chronic obstructive pulmonary disease, unspecified: Secondary | ICD-10-CM | POA: Diagnosis not present

## 2017-02-25 DIAGNOSIS — K5732 Diverticulitis of large intestine without perforation or abscess without bleeding: Secondary | ICD-10-CM | POA: Insufficient documentation

## 2017-02-25 MED ORDER — CIPROFLOXACIN HCL 500 MG PO TABS: 500.0000 mg | ORAL_TABLET | Freq: Two times a day (BID) | ORAL | 0 refills | Status: DC

## 2017-02-25 MED ORDER — SODIUM CHLORIDE 0.9 % IV BOLUS (SEPSIS)
1000.0000 mL | Freq: Once | INTRAVENOUS | Status: AC
  Administered 2017-02-25: 1000 mL via INTRAVENOUS

## 2017-02-25 MED ORDER — METRONIDAZOLE 500 MG PO TABS: 500.0000 mg | ORAL_TABLET | Freq: Two times a day (BID) | ORAL | 0 refills | Status: DC

## 2017-02-25 MED ORDER — ONDANSETRON HCL 4 MG/2ML IJ SOLN
4.0000 mg | Freq: Once | INTRAMUSCULAR | Status: AC
  Administered 2017-02-25: 4 mg via INTRAVENOUS
  Filled 2017-02-25: qty 2

## 2017-02-25 MED ORDER — IOPAMIDOL (ISOVUE-300) INJECTION 61%
100.0000 mL | Freq: Once | INTRAVENOUS | Status: AC | PRN
  Administered 2017-02-25: 100 mL via INTRAVENOUS

## 2017-02-25 MED FILL — metroNIDAZOLE 500 MG TABS: 500 | 7 days supply | Qty: 14 | Fill #0

## 2017-02-25 MED FILL — CIPROFLOXACIN HCL 500 MG TA: 500 | 7 days supply | Qty: 14 | Fill #0

## 2017-02-25 NOTE — ED Notes (Signed)
Attempted IV to bil AC, tol well.

## 2017-02-25 NOTE — ED Triage Notes (Signed)
C/o constipation x 1 week-can not recall last normal BM-NAD-steady gait

## 2017-02-25 NOTE — Discharge Instructions (Signed)
Follow up with your PCP.  You had a nodule noted in your right lower lung.  This needs further imaging.  Please discuss with your family doc.

## 2017-02-25 NOTE — ED Provider Notes (Signed)
[place] [facility] Provider Note   CSN: [id number] Arrival date & time: 02/25/17  1324     History   Chief Complaint Chief Complaint  Patient presents with  . Constipation    HPI KAYTLYN DIN is a 71 y.o. female.  71 yo F with a chief complaint of lower abdominal pain. She also has been constipated and doesn't know when her last bowel movement was. Patient has a chronic issue with constipation and takes MiraLAX every day. She's never had an issue like this though. She took magnesium sulfate without improvement. Feels that she has a pressure in her rectum but is unable to pass any stool. Denies fevers or chills. Has had nausea but denies vomiting. Patient has had a hysterectomy but denies other abdominal surgery. She is concerned that she has a small bowel obstruction.   The history is provided by the patient and the spouse.  Constipation   This is a new problem. The current episode started more than 1 week ago. Associated symptoms include abdominal pain. Pertinent negatives include no dysuria. She has tried stimulants and osmotic agents for the symptoms. The treatment provided no relief.  Illness  This is a recurrent problem. The current episode started more than 1 week ago. The problem occurs constantly. The problem has not changed since onset.Associated symptoms include abdominal pain. Pertinent negatives include no chest pain, no headaches and no shortness of breath. Nothing aggravates the symptoms. Nothing relieves the symptoms. Treatments tried: laxatives. The treatment provided no relief.    Past Medical History:  Diagnosis Date  . Asthma   . COPD (chronic obstructive pulmonary disease) ([facility])   . Hypertension   . Nosebleed     There are no active problems to display for this patient.   Past Surgical History:  Procedure Laterality Date  . ABDOMINAL HYSTERECTOMY    . BACK SURGERY      OB History    No data available       Home Medications    Prior to  Admission medications   Medication Sig Start Date End Date Taking? Authorizing Provider  ALENDRONATE SODIUM PO Take by mouth.   Yes [provider]  Lifitegrast Shirley Friar OP) Apply to eye.   Yes [provider]  RaNITidine HCl (ZANTAC PO) Take by mouth.   Yes [provider]  Umeclidinium-Vilanterol (ANORO ELLIPTA IN) Inhale into the lungs.   Yes [provider]  albuterol (PROVENTIL HFA;VENTOLIN HFA) 108 (90 BASE) MCG/ACT inhaler Inhale 2 puffs into the lungs every 6 (six) hours as needed. For shortness of breath.    [provider]  ALPRAZolam Duanne Moron) 0.25 MG tablet Take 0.125 mg by mouth every morning.     [provider]  bisoprolol (ZEBETA) 5 MG tablet Take 2.5 mg by mouth daily.     [provider]  ciprofloxacin (CIPRO) 500 MG tablet Take 1 tablet (500 mg total) by mouth 2 (two) times daily. One po bid x 7 days 02/25/17   Deno Etienne, DO  ibuprofen (ADVIL,MOTRIN) 200 MG tablet Take 200-400 mg by mouth every 6 (six) hours as needed. For pain.    [provider]  metroNIDAZOLE (FLAGYL) 500 MG tablet Take 1 tablet (500 mg total) by mouth 2 (two) times daily. One po bid x 7 days 02/25/17   Deno Etienne, DO  montelukast (SINGULAIR) 10 MG tablet Take 10 mg by mouth at bedtime.      [provider]    Family History Family  History  Problem Relation Age of Onset  . Cancer Mother   . Diabetes Father     Social History Social History  Substance Use Topics  . Smoking status: Current Every Day Smoker    Packs/day: 1.00    Types: Cigarettes  . Smokeless tobacco: Never Used  . Alcohol use 16.8 oz/week    28 Cans of beer per week     Comment: "at least of couple of beers every day"     Allergies   Aspirin; Latex; Penicillins; and Sulfa antibiotics   Review of Systems Review of Systems  Constitutional: Negative for chills and fever.  HENT: Negative for congestion and rhinorrhea.   Eyes: Negative for redness  and visual disturbance.  Respiratory: Negative for shortness of breath and wheezing.   Cardiovascular: Negative for chest pain and palpitations.  Gastrointestinal: Positive for abdominal pain, constipation and nausea. Negative for vomiting.  Genitourinary: Negative for dysuria and urgency.  Musculoskeletal: Negative for arthralgias and myalgias.  Skin: Negative for pallor and wound.  Neurological: Negative for dizziness and headaches.     Physical Exam Updated Vital Signs BP (!) 131/58 (BP Location: Right Arm)   Pulse 75   Temp 97.9 F (36.6 C) (Oral)   Resp 18   Ht 5\' 2"  (1.575 m)   Wt 63.5 kg (140 lb)   SpO2 97%   BMI 25.61 kg/m   Physical Exam  Constitutional: She is oriented to person, place, and time. She appears well-developed and well-nourished. No distress.  HENT:  Head: Normocephalic and atraumatic.  Eyes: Pupils are equal, round, and reactive to light. EOM are normal.  Neck: Normal range of motion. Neck supple.  Cardiovascular: Normal rate and regular rhythm.  Exam reveals no gallop and no friction rub.   No murmur heard. Pulmonary/Chest: Effort normal. She has no wheezes. She has no rales.  Abdominal: Soft. She exhibits no distension and no mass. There is no tenderness. There is no guarding.  Musculoskeletal: She exhibits no edema or tenderness.  Neurological: She is alert and oriented to person, place, and time.  Skin: Skin is warm and dry. She is not diaphoretic.  Psychiatric: She has a normal mood and affect. Her behavior is normal.  Nursing note and vitals reviewed.    ED Treatments / Results  Labs (all labs ordered are listed, but only abnormal results are displayed) Labs Reviewed - No data to display  EKG  EKG Interpretation None       Radiology Ct Abdomen Pelvis W Contrast  Result Date: 02/25/2017 CLINICAL DATA:  Lower abdominal pain for several days EXAM: CT ABDOMEN AND PELVIS WITH CONTRAST TECHNIQUE: Multidetector CT imaging of the abdomen  and pelvis was performed using the standard protocol following bolus administration of intravenous contrast. CONTRAST:  139mL ISOVUE-300 IOPAMIDOL (ISOVUE-300) INJECTION 61% COMPARISON:  08/24/2014 FINDINGS: Lower chest: Lung bases are well aerated. There is a somewhat nodular appearing density in the right lower lobe which was not present on the prior exam. It measures approximately 11 mm in short axis. Hepatobiliary: Multiple gallstones are noted. The liver is within normal limits. Pancreas: Unremarkable. No pancreatic ductal dilatation or surrounding inflammatory changes. Spleen: Normal in size without focal abnormality. Adrenals/Urinary Tract: 2.2 cm soft tissue mass lesion is noted within the right adrenal gland. This is relatively stable from the prior exam and likely represents a focal adenoma. The left adrenal gland is within normal limits. The kidneys are within normal limits. The bladder is partially decompressed. Stomach/Bowel: Scattered diverticular  change of the colon is noted with evidence of pericolonic inflammatory change consistent with diverticulitis. No abscess or perforation is noted at this time. The appendix is within normal limits. No obstructive changes are seen. Vascular/Lymphatic: Aortic atherosclerosis. No enlarged abdominal or pelvic lymph nodes. Reproductive: Status post hysterectomy. No adnexal masses. Other: No abdominal wall hernia or abnormality. No abdominopelvic ascites. Musculoskeletal: No acute or significant osseous findings. IMPRESSION: Changes of diverticulitis in the sigmoid colon without abscess or perforation. 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 Cholelithiasis without complicating factors. Nodularity in the right lower lobe measuring approximately 11 mm with some surrounding inflammatory change. Consider one of the following in 3 months for both low-risk and high-risk individuals: (a) repeat chest CT, (b) follow-up PET-CT, or (c) tissue sampling.  This recommendation follows the consensus statement: Guidelines for Management of Incidental Pulmonary Nodules Detected on CT Images: From the Fleischner Society 2017; Radiology 2017; 284:228-243. Electronically Signed   By: Inez Catalina M.D.   On: 02/25/2017 15:36    Procedures Procedures (including critical care time)  Medications Ordered in ED Medications  ondansetron (ZOFRAN) injection 4 mg (4 mg Intravenous Given 02/25/17 1422)  sodium chloride 0.9 % bolus 1,000 mL (0 mLs Intravenous Stopped 02/25/17 1526)  iopamidol (ISOVUE-300) 61 % injection 100 mL (100 mLs Intravenous Contrast Given 02/25/17 1500)     Initial Impression / Assessment and Plan / ED Course  I have reviewed the triage vital signs and the nursing notes.  Pertinent labs & imaging results that were available during my care of the patient were reviewed by me and considered in my medical decision making (see chart for details).     70 yo F with a chief complaint of crampy lower abdominal pain. Most likely the patient is constipated though she does have advanced age and has passed flatus but has had severe difficulty with it. Will obtain a CT scan with oral contrast.  CT with diverticulitis.  Treat with abx.  PCP follow up.    3:44 PM:  I have discussed the diagnosis/risks/treatment options with the patient and family and believe the pt to be eligible for discharge home to follow-up with PCP. We also discussed returning to the ED immediately if new or worsening sx occur. We discussed the sx which are most concerning (e.g., sudden worsening pain, fever, inability to tolerate by mouth) that necessitate immediate return. Medications administered to the patient during their visit and any new prescriptions provided to the patient are listed below.  Medications given during this visit Medications  ondansetron (ZOFRAN) injection 4 mg (4 mg Intravenous Given 02/25/17 1422)  sodium chloride 0.9 % bolus 1,000 mL (0 mLs Intravenous  Stopped 02/25/17 1526)  iopamidol (ISOVUE-300) 61 % injection 100 mL (100 mLs Intravenous Contrast Given 02/25/17 1500)     The patient appears reasonably screen and/or stabilized for discharge and I doubt any other medical condition or other [facility] requiring further screening, evaluation, or treatment in the ED at this time prior to discharge.    Final Clinical Impressions(s) / ED Diagnoses   Final diagnoses:  Diverticulitis    New Prescriptions New Prescriptions   CIPROFLOXACIN (CIPRO) 500 MG TABLET    Take 1 tablet (500 mg total) by mouth 2 (two) times daily. One po bid x 7 days   METRONIDAZOLE (FLAGYL) 500 MG TABLET    Take 1 tablet (500 mg total) by mouth 2 (two) times daily. One po bid x 7 days     Deno Etienne,  DO 02/25/17 1544

## 2017-06-05 ENCOUNTER — Other Ambulatory Visit (HOSPITAL_COMMUNITY): Payer: Self-pay | Admitting: Internal Medicine

## 2017-06-05 DIAGNOSIS — R911 Solitary pulmonary nodule: Secondary | ICD-10-CM

## 2017-06-12 ENCOUNTER — Ambulatory Visit (HOSPITAL_COMMUNITY)
Admission: RE | Admit: 2017-06-12 | Discharge: 2017-06-12 | Disposition: A | Discharge status: Home / Self Care | Payer: Medicare Other | Source: Ambulatory Visit | Attending: Internal Medicine | Admitting: Internal Medicine

## 2017-06-12 DIAGNOSIS — J439 Emphysema, unspecified: Secondary | ICD-10-CM | POA: Insufficient documentation

## 2017-06-12 DIAGNOSIS — R911 Solitary pulmonary nodule: Secondary | ICD-10-CM | POA: Diagnosis present

## 2017-06-12 DIAGNOSIS — I700 Atherosclerosis of aorta: Secondary | ICD-10-CM | POA: Diagnosis not present

## 2017-06-12 DIAGNOSIS — I2510 Atherosclerotic heart disease of native coronary artery without angina pectoris: Secondary | ICD-10-CM | POA: Diagnosis not present

## 2017-06-12 MED ORDER — IOPAMIDOL (ISOVUE-300) INJECTION 61%
INTRAVENOUS | Status: AC
  Administered 2017-06-12: 75 mL
  Filled 2017-06-12: qty 75

## 2018-02-16 ENCOUNTER — Other Ambulatory Visit: Payer: Self-pay | Admitting: Obstetrics & Gynecology

## 2019-01-28 ENCOUNTER — Other Ambulatory Visit: Payer: Self-pay

## 2019-01-28 DIAGNOSIS — Z20822 Contact with and (suspected) exposure to covid-19: Secondary | ICD-10-CM

## 2019-01-31 LAB — NOVEL CORONAVIRUS, NAA: SARS-CoV-2, NAA: NOT DETECTED

## 2019-02-14 ENCOUNTER — Encounter (HOSPITAL_BASED_OUTPATIENT_CLINIC_OR_DEPARTMENT_OTHER): Payer: Self-pay | Admitting: *Deleted

## 2019-02-14 ENCOUNTER — Emergency Department (HOSPITAL_BASED_OUTPATIENT_CLINIC_OR_DEPARTMENT_OTHER): Payer: Medicare Other

## 2019-02-14 ENCOUNTER — Emergency Department (HOSPITAL_BASED_OUTPATIENT_CLINIC_OR_DEPARTMENT_OTHER)
Admission: EM | Admit: 2019-02-14 | Discharge: 2019-02-14 | Disposition: A | Discharge status: Home / Self Care | Payer: Medicare Other | Attending: Emergency Medicine | Admitting: Emergency Medicine

## 2019-02-14 ENCOUNTER — Other Ambulatory Visit: Payer: Self-pay

## 2019-02-14 DIAGNOSIS — Z79899 Other long term (current) drug therapy: Secondary | ICD-10-CM | POA: Insufficient documentation

## 2019-02-14 DIAGNOSIS — Z91040 Latex allergy status: Secondary | ICD-10-CM | POA: Diagnosis not present

## 2019-02-14 DIAGNOSIS — J449 Chronic obstructive pulmonary disease, unspecified: Secondary | ICD-10-CM | POA: Diagnosis not present

## 2019-02-14 DIAGNOSIS — R1012 Left upper quadrant pain: Secondary | ICD-10-CM | POA: Diagnosis present

## 2019-02-14 DIAGNOSIS — K5792 Diverticulitis of intestine, part unspecified, without perforation or abscess without bleeding: Secondary | ICD-10-CM

## 2019-02-14 DIAGNOSIS — I10 Essential (primary) hypertension: Secondary | ICD-10-CM | POA: Diagnosis not present

## 2019-02-14 DIAGNOSIS — F17210 Nicotine dependence, cigarettes, uncomplicated: Secondary | ICD-10-CM | POA: Insufficient documentation

## 2019-02-14 LAB — COMPREHENSIVE METABOLIC PANEL
ALT: 19 U/L (ref 0–44)
AST: 23 U/L (ref 15–41)
Albumin: 5.2 g/dL — ABNORMAL HIGH (ref 3.5–5.0)
Alkaline Phosphatase: 65 U/L (ref 38–126)
Anion gap: 12 (ref 5–15)
BUN: 16 mg/dL (ref 8–23)
CO2: 25 mmol/L (ref 22–32)
Calcium: 9.5 mg/dL (ref 8.9–10.3)
Chloride: 95 mmol/L — ABNORMAL LOW (ref 98–111)
Creatinine, Ser: 0.6 mg/dL (ref 0.44–1.00)
GFR calc Af Amer: >60 mL/min (ref >60)
GFR calc non Af Amer: >60 mL/min (ref >60)
Glucose, Bld: 118 mg/dL — ABNORMAL HIGH (ref 70–99)
Potassium: 4 mmol/L (ref 3.5–5.1)
Sodium: 132 mmol/L — ABNORMAL LOW (ref 135–145)
Total Bilirubin: 0.6 mg/dL (ref 0.3–1.2)
Total Protein: 8.6 g/dL — ABNORMAL HIGH (ref 6.5–8.1)

## 2019-02-14 LAB — CBC
HCT: 49.4 % — ABNORMAL HIGH (ref 36.0–46.0)
Hemoglobin: 16 g/dL — ABNORMAL HIGH (ref 12.0–15.0)
MCH: 31.5 pg (ref 26.0–34.0)
MCHC: 32.4 g/dL (ref 30.0–36.0)
MCV: 97.2 fL (ref 80.0–100.0)
Platelets: 330 10*3/uL (ref 150–400)
RBC: 5.08 MIL/uL (ref 3.87–5.11)
RDW: 11.7 % (ref 11.5–15.5)
WBC: 10 10*3/uL (ref 4.0–10.5)
nRBC: 0 % (ref 0.0–0.2)

## 2019-02-14 LAB — LIPASE, BLOOD: Lipase: 56 U/L — ABNORMAL HIGH (ref 11–51)

## 2019-02-14 MED ORDER — METRONIDAZOLE 500 MG PO TABS: 500.0000 mg | ORAL_TABLET | Freq: Three times a day (TID) | ORAL | 0 refills | Status: DC

## 2019-02-14 MED ORDER — SODIUM CHLORIDE 0.9 % IV BOLUS
1000.0000 mL | Freq: Once | INTRAVENOUS | Status: AC
  Administered 2019-02-14: 14:00:00 1000 mL via INTRAVENOUS

## 2019-02-14 MED ORDER — SODIUM CHLORIDE 0.9% FLUSH
3.0000 mL | Freq: Once | INTRAVENOUS | Status: DC
  Filled 2019-02-14: qty 3

## 2019-02-14 MED ORDER — CIPROFLOXACIN HCL 500 MG PO TABS: 500.0000 mg | ORAL_TABLET | Freq: Two times a day (BID) | ORAL | 0 refills | Status: DC

## 2019-02-14 MED ORDER — HYDROCODONE-ACETAMINOPHEN 5-325 MG PO TABS: 1.0000 | ORAL_TABLET | Freq: Four times a day (QID) | ORAL | 0 refills | Status: DC | PRN

## 2019-02-14 MED ORDER — IOHEXOL 300 MG/ML  SOLN
100.0000 mL | Freq: Once | INTRAMUSCULAR | Status: AC | PRN
  Administered 2019-02-14: 100 mL via INTRAVENOUS

## 2019-02-14 MED ORDER — ALBUTEROL SULFATE HFA 108 (90 BASE) MCG/ACT IN AERS
INHALATION_SPRAY | RESPIRATORY_TRACT | Status: AC
  Administered 2019-02-14: 4
  Filled 2019-02-14: qty 6.7

## 2019-02-14 NOTE — ED Triage Notes (Signed)
Abdominal pain, back pain and diarrhea x x 3 weeks. No appetite.

## 2019-02-14 NOTE — ED Provider Notes (Signed)
[facility] EMERG[facility] DEPARTMENT Provider Note   CSN: [id number] Arrival date & time: 02/14/19  1216     History   Chief Complaint Chief Complaint  Patient presents with  . Abdominal Pain  . Back Pain  . Diarrhea    HPI Deanna Burns is a 73 y.o. female.     Patient is a 73 year old female with past medical history of hypertension and COPD.  She presents today with a 3-week history of abdominal bloating, cramping, and loose stools.  These have all been nonbloody.  She reports several pound weight loss during this period of time.  She denies any fevers or chills.  She denies any ill contacts.  Her cramping is mainly located to the left side of her abdomen.  The history is provided by the patient.  Abdominal Pain Pain location:  LUQ and LLQ Pain quality: cramping   Pain radiates to:  Does not radiate Pain severity:  Moderate Onset quality:  Gradual Duration:  3 weeks Timing:  Intermittent Progression:  Worsening Chronicity:  New Relieved by:  Nothing Worsened by:  Nothing Ineffective treatments:  None tried Associated symptoms: diarrhea   Back Pain Associated symptoms: abdominal pain   Diarrhea Associated symptoms: abdominal pain     Past Medical History:  Diagnosis Date  . Asthma   . COPD (chronic obstructive pulmonary disease) ([place])   . Hypertension   . Nosebleed     There are no active problems to display for this patient.   Past Surgical History:  Procedure Laterality Date  . ABDOMINAL HYSTERECTOMY    . BACK SURGERY       OB History   No obstetric history on file.      Home Medications    Prior to Admission medications   Medication Sig Start Date End Date Taking? Authorizing Provider  albuterol (PROVENTIL HFA;VENTOLIN HFA) 108 (90 BASE) MCG/ACT inhaler Inhale 2 puffs into the lungs every 6 (six) hours as needed. For shortness of breath.   Yes [provider]  ALENDRONATE SODIUM PO Take by mouth.   Yes [provider]  ALPRAZolam (XANAX) 0.25 MG tablet Take 0.125 mg by mouth every morning.    Yes [provider]  bisoprolol (ZEBETA) 5 MG tablet Take 2.5 mg by mouth daily.    Yes [provider]  ibuprofen (ADVIL,MOTRIN) 200 MG tablet Take 200-400 mg by mouth every 6 (six) hours as needed. For pain.   Yes [provider]  Lifitegrast Shirley Friar OP) Apply to eye.   Yes [provider]  montelukast (SINGULAIR) 10 MG tablet Take 10 mg by mouth at bedtime.     Yes [provider]  Umeclidinium-Vilanterol (ANORO ELLIPTA IN) Inhale into the lungs.   Yes [provider]  ciprofloxacin (CIPRO) 500 MG tablet Take 1 tablet (500 mg total) by mouth 2 (two) times daily. One po bid x 7 days 02/25/17   Deno Etienne, DO  metroNIDAZOLE (FLAGYL) 500 MG tablet Take 1 tablet (500 mg total) by mouth 2 (two) times daily. One po bid x 7 days 02/25/17   Deno Etienne, DO  RaNITidine HCl (ZANTAC PO) Take by mouth.    [provider]    Family History Family History  Problem Relation Age of Onset  . Cancer Mother   . Diabetes Father     Social History Social History   Tobacco Use  . Smoking status: Current Every Day Smoker    Packs/day: 1.00    Types:  Cigarettes  . Smokeless tobacco: Never Used  Substance Use Topics  . Alcohol use: Yes    Alcohol/week: 28.0 standard drinks    Types: 28 Cans of beer per week    Comment: "at least of couple of beers every day"  . Drug use: No     Allergies   Aspirin, Latex, Penicillins, and Sulfa antibiotics   Review of Systems Review of Systems  Gastrointestinal: Positive for abdominal pain and diarrhea.  Musculoskeletal: Positive for back pain.  All other systems reviewed and are negative.    Physical Exam Updated Vital Signs BP (!) 186/98 (BP Location: Right Arm)   Pulse 89   Temp 97.7 F (36.5 C) (Oral)   Resp (!) 30   Ht 5\' 3"  (1.6 m)   Wt 55.3 kg   SpO2 94%   BMI 21.61 kg/m   Physical Exam  Vitals signs and nursing note reviewed.  Constitutional:      General: She is not in acute distress.    Appearance: She is well-developed. She is not diaphoretic.  HENT:     Head: Normocephalic and atraumatic.  Neck:     Musculoskeletal: Normal range of motion and neck supple.  Cardiovascular:     Rate and Rhythm: Normal rate and regular rhythm.     Heart sounds: No murmur. No friction rub. No gallop.   Pulmonary:     Effort: Pulmonary effort is normal. No respiratory distress.     Breath sounds: Normal breath sounds. No wheezing.  Abdominal:     General: Bowel sounds are normal. There is no distension.     Palpations: Abdomen is soft.     Tenderness: There is abdominal tenderness in the left upper quadrant and left lower quadrant. There is no right CVA tenderness, left CVA tenderness, guarding or rebound.  Musculoskeletal: Normal range of motion.  Skin:    General: Skin is warm and dry.  Neurological:     Mental Status: She is alert and oriented to person, place, and time.      ED Treatments / Results  Labs (all labs ordered are listed, but only abnormal results are displayed) Labs Reviewed  LIPASE, BLOOD - Abnormal; Notable for the following components:      Result Value   Lipase 56 (*)    All other components within normal limits  COMPREHENSIVE METABOLIC PANEL - Abnormal; Notable for the following components:   Sodium 132 (*)    Chloride 95 (*)    Glucose, Bld 118 (*)    Total Protein 8.6 (*)    Albumin 5.2 (*)    All other components within normal limits  CBC - Abnormal; Notable for the following components:   Hemoglobin 16.0 (*)    HCT 49.4 (*)    All other components within normal limits  URINALYSIS, ROUTINE W REFLEX MICROSCOPIC    EKG None  Radiology No results found.  Procedures Procedures (including critical care time)  Medications Ordered in ED Medications  sodium chloride flush (NS) 0.9 % injection 3 mL (3 mLs Intravenous Not Given 02/14/19 1249)   sodium chloride 0.9 % bolus 1,000 mL (has no administration in time range)  albuterol (VENTOLIN HFA) 108 (90 Base) MCG/ACT inhaler (4 puffs  Given 02/14/19 1248)     Initial Impression / Assessment and Plan / ED Course  I have reviewed the triage vital signs and the nursing notes.  Pertinent labs & imaging results that were available during my care of the patient were reviewed  by me and considered in my medical decision making (see chart for details).  Work-up shows acute, uncomplicated diverticulitis.  Patient will be treated with Cipro and Flagyl, pain medication, and return as needed for any problems.  Final Clinical Impressions(s) / ED Diagnoses   Final diagnoses:  None    ED Discharge Orders    None       Veryl Speak, MD 02/14/19 1450

## 2019-02-14 NOTE — Discharge Instructions (Addendum)
Begin taking Cipro and Flagyl as prescribed.  Hydrocodone as prescribed as needed for pain.  Follow-up with your primary doctor if not improving in the next few days, and return to the ER if you develop worsening pain, high fever, bloody stool, or other new and concerning symptoms.

## 2019-07-29 ENCOUNTER — Ambulatory Visit: Payer: Medicare Other | Attending: Internal Medicine

## 2019-07-29 DIAGNOSIS — Z23 Encounter for immunization: Secondary | ICD-10-CM

## 2019-07-29 NOTE — Progress Notes (Signed)
   Covid-19 Vaccination Clinic  Name:  AVALEIGH DECUIR    MRN: [id number] DOB: 03-08-1946  07/29/2019  Ms. Mareno was observed post Covid-19 immunization for 15 minutes without incidence. She was provided with Vaccine Information Sheet and instruction to access the V-Safe system.   Ms. Russaw was instructed to call 911 with any severe reactions post vaccine: Marland Kitchen Difficulty breathing  . Swelling of your face and throat  . A fast heartbeat  . A bad rash all over your body  . Dizziness and weakness    Immunizations Administered    Name Date Dose VIS Date Route   Pfizer COVID-19 Vaccine 07/29/2019  5:18 PM 0.3 mL 06/17/2019 Intramuscular   Manufacturer: [employer] [employer]   Lot: [id number]   [place]: [address]-8

## 2019-08-19 ENCOUNTER — Ambulatory Visit: Payer: Medicare Other | Attending: Internal Medicine

## 2019-08-19 ENCOUNTER — Other Ambulatory Visit: Payer: Self-pay

## 2019-08-19 DIAGNOSIS — Z23 Encounter for immunization: Secondary | ICD-10-CM | POA: Insufficient documentation

## 2019-08-19 NOTE — Progress Notes (Signed)
   Covid-19 Vaccination Clinic  Name:  Deanna Burns    MRN: [id number] DOB: [birth year]  08/19/2019  Deanna Burns was observed post Covid-19 immunization for 15 minutes without incidence. She was provided with Vaccine Information Sheet and instruction to access the V-Safe system.   Deanna Burns was instructed to call 911 with any severe reactions post vaccine: Marland Kitchen Difficulty breathing  . Swelling of your face and throat  . A fast heartbeat  . A bad rash all over your body  . Dizziness and weakness    Immunizations Administered    Name Date Dose VIS Date Route   Pfizer COVID-19 Vaccine 08/19/2019  4:37 PM 0.3 mL 06/17/2019 Intramuscular   Manufacturer: [place]   Lot: [id number]   [place]: [address]-7

## 2019-12-29 ENCOUNTER — Ambulatory Visit
Admission: RE | Admit: 2019-12-29 | Discharge: 2019-12-29 | Disposition: A | Discharge status: Home / Self Care | Payer: Self-pay | Source: Ambulatory Visit | Attending: Radiation Oncology | Admitting: Radiation Oncology

## 2019-12-29 ENCOUNTER — Other Ambulatory Visit: Payer: Self-pay

## 2019-12-29 DIAGNOSIS — C051 Malignant neoplasm of soft palate: Secondary | ICD-10-CM

## 2019-12-30 ENCOUNTER — Other Ambulatory Visit: Payer: Self-pay

## 2019-12-30 NOTE — Progress Notes (Signed)
Oncology Nurse Navigator Documentation  Placed introductory call to new referral patient Deanna Burns  Introduced myself as the H&N oncology nurse navigator that works with Dr. Isidore Moos to whom she has been referred by Dr. Pablo Ledger.  She confirmed understanding of referral.  Briefly explained my role as his navigator, provided my contact information.   Confirmed understanding of upcoming appts and [place] location, explained arrival and registration process.  I explained the purpose of a dental evaluation prior to starting RT, indicated she would be contacted by [facility] to arrange an appt.    I encouraged her to call with questions/concerns as she moves forward with appts and procedures.    She verbalized understanding of information provided, expressed appreciation for my call.   Navigator Initial Assessment . Employment Status: She is retired . Currently on FMLA / STD: n/a . Living Situation: She lives with her husband.  . Support System: Her husband . PCP: Dr. Emily Filbert in [place]  . PCD: Dr. Rip Harbour in [place] . Financial Concerns: no . Transportation Needs: no . Sensory Deficits: no . Language Barriers/Interpreter Needed:  no . Ambulation Needs: no . DME Used in Home: no . Psychosocial Needs:  no . Concerns/Needs Understanding Cancer:  addressed/answered by navigator to best of ability . Self-Expressed Needs: no  Harlow Asa RN, BSN, OCN Head & Neck Oncology Nurse [facility] Phone # [phone]  Fax # [phone]

## 2020-01-02 ENCOUNTER — Telehealth (HOSPITAL_COMMUNITY): Payer: Self-pay

## 2020-01-02 NOTE — Telephone Encounter (Signed)
Multiple attempts to contact patient to schedule a New Patient consultation with Dental Medicine. Left message on machine for patient to call to schedule appointment.

## 2020-01-03 ENCOUNTER — Other Ambulatory Visit: Payer: Self-pay

## 2020-01-03 ENCOUNTER — Ambulatory Visit (HOSPITAL_COMMUNITY): Payer: Self-pay | Admitting: Dentistry

## 2020-01-03 ENCOUNTER — Encounter (HOSPITAL_COMMUNITY): Payer: Self-pay | Admitting: Dentistry

## 2020-01-03 VITALS — BP 153/73 | HR 91 | Temp 98.3°F

## 2020-01-03 DIAGNOSIS — M2629 Other anomalies of dental arch relationship: Secondary | ICD-10-CM

## 2020-01-03 DIAGNOSIS — K06010 Localized gingival recession, unspecified: Secondary | ICD-10-CM

## 2020-01-03 DIAGNOSIS — Z01818 Encounter for other preprocedural examination: Secondary | ICD-10-CM

## 2020-01-03 DIAGNOSIS — K0889 Other specified disorders of teeth and supporting structures: Secondary | ICD-10-CM

## 2020-01-03 DIAGNOSIS — M278 Other specified diseases of jaws: Secondary | ICD-10-CM

## 2020-01-03 DIAGNOSIS — K08409 Partial loss of teeth, unspecified cause, unspecified class: Secondary | ICD-10-CM

## 2020-01-03 DIAGNOSIS — K0850 Unsatisfactory restoration of tooth, unspecified: Secondary | ICD-10-CM

## 2020-01-03 DIAGNOSIS — M264 Malocclusion, unspecified: Secondary | ICD-10-CM

## 2020-01-03 DIAGNOSIS — C051 Malignant neoplasm of soft palate: Secondary | ICD-10-CM

## 2020-01-03 DIAGNOSIS — K0530 Chronic periodontitis, unspecified: Secondary | ICD-10-CM

## 2020-01-03 DIAGNOSIS — K029 Dental caries, unspecified: Secondary | ICD-10-CM

## 2020-01-03 DIAGNOSIS — K036 Deposits [accretions] on teeth: Secondary | ICD-10-CM

## 2020-01-03 DIAGNOSIS — Z91040 Latex allergy status: Secondary | ICD-10-CM

## 2020-01-03 MED ORDER — SODIUM FLUORIDE 1.1 % DT CREA: TOPICAL_CREAM | DENTAL | 99 refills | Status: AC

## 2020-01-03 NOTE — Progress Notes (Signed)
DENTAL CONSULTATION  Date of Consultation:  01/03/2020 Patient Name:   Deanna Burns Date of Birth:   1946-02-05 Medical Record Number: [id number]  COVID 19 SCREENING: The patient does not symptoms concerning for COVID-19 infection (Including fever, chills, cough, or new SHORTNESS OF BREATH).    VITALS: BP (!) 153/73 (BP Location: Right Arm)   Pulse 91   Temp 98.3 F (36.8 C)   CHIEF COMPLAINT: Patient referred by Dr. Isidore Moos for dental consultation.  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  Patient with anticipated radiation therapy.  Patient is now seen as part of a medically necessary preradiation therapy dental protocol examination.  The patient currently denies acute toothaches, swellings, or abscesses.  Patient was last seen by her primary dentist, Dr. Laurena Bering, on 11/21/2019. The patient was then referred to an endodontist for root canal therapy on tooth #14.  Patient saw Dr. Starla Link for the root canal therapy on 11/23/2019.  Patient was last seen by Dr. Satira Sark for periodontal maintenance procedure on 07/13/2019.  The patient usually alternates periodontal therapy with the periodontist and with her primary dentist.  The patient's last dental cleaning with her primary dentist, Dr. Laurena Bering, was on 12/08/2017 secondary to Covid precautions.  Patient denies having partial dentures.  Patient denies having dental phobia.  PROBLEM LIST: Patient Active Problem List   Diagnosis Date Noted  . Cancer of soft palate ([place]) 01/03/2020    PMH: Past Medical History:  Diagnosis Date  . Asthma   . COPD (chronic obstructive pulmonary disease) ([place])   . Hypertension   . Nosebleed     PSH: Past Surgical History:  Procedure Laterality Date  . ABDOMINAL HYSTERECTOMY    . BACK SURGERY      ALLERGIES: Allergies  Allergen Reactions  . Aspirin Swelling    Irritates stomach leads to vomiting   . Latex   . Penicillins  Swelling  . Sulfa Antibiotics Swelling    MEDICATIONS: Current Outpatient Medications  Medication Sig Dispense Refill  . albuterol (PROVENTIL HFA;VENTOLIN HFA) 108 (90 BASE) MCG/ACT inhaler Inhale 2 puffs into the lungs every 6 (six) hours as needed. For shortness of breath.    . ALPRAZolam (XANAX) 0.25 MG tablet Take 0.125 mg by mouth every morning.     Marland Kitchen aluminum hydroxide-magnesium carbonate (GAVISCON) 95-358 MG/15ML SUSP Take 15 mLs by mouth as needed.    . bisoprolol (ZEBETA) 5 MG tablet Take 2.5 mg by mouth daily.     . cycloSPORINE (RESTASIS) 0.05 % ophthalmic emulsion Place 1 drop into both eyes 2 (two) times daily.    Marland Kitchen ibuprofen (ADVIL,MOTRIN) 200 MG tablet Take 200-400 mg by mouth every 6 (six) hours as needed. For pain.    . montelukast (SINGULAIR) 10 MG tablet Take 10 mg by mouth at bedtime.      . polyethylene glycol (MIRALAX / GLYCOLAX) 17 g packet Take 17 g by mouth daily as needed.    Marland Kitchen Umeclidinium-Vilanterol (ANORO ELLIPTA IN) Inhale into the lungs.    . ALENDRONATE SODIUM PO Take by mouth. (Patient not taking: Reported on 01/03/2020)    . HYDROcodone-acetaminophen (NORCO) 5-325 MG tablet Take 1-2 tablets by mouth every 6 (six) hours as needed. (Patient not taking: Reported on 01/03/2020) 12 tablet 0   No current facility-administered medications for this visit.     LABS: Lab Results  Component Value Date   WBC 10.0 02/14/2019   HGB 16.0 (H) 02/14/2019  HCT 49.4 (H) 02/14/2019   MCV 97.2 02/14/2019   PLT 330 02/14/2019      Component Value Date/Time   NA 132 (L) 02/14/2019 1244   K 4.0 02/14/2019 1244   CL 95 (L) 02/14/2019 1244   CO2 25 02/14/2019 1244   GLUCOSE 118 (H) 02/14/2019 1244   BUN 16 02/14/2019 1244   CREATININE 0.60 02/14/2019 1244   CREATININE 0.64 12/02/2011 1026   CALCIUM 9.5 02/14/2019 1244   GFRNONAA >60 02/14/2019 1244   GFRNONAA >60 12/02/2011 1026   GFRAA >60 02/14/2019 1244   GFRAA >60 12/02/2011 1026   No results found for: INR,  PROTIME No results found for: PTT  SOCIAL HISTORY: Social History   Socioeconomic History  . Marital status: Married    Spouse name: Not on file  . Number of children: Not on file  . Years of education: Not on file  . Highest education level: Not on file  Occupational History  . Not on file  Tobacco Use  . Smoking status: Current Every Day Smoker    Packs/day: 1.00    Types: Cigarettes  . Smokeless tobacco: Never Used  Vaping Use  . Vaping Use: Never used  Substance and Sexual Activity  . Alcohol use: Yes    Alcohol/week: 28.0 standard drinks    Types: 28 Cans of beer per week    Comment: "at least of couple of beers every day"  . Drug use: No  . Sexual activity: Not on file  Other Topics Concern  . Not on file  Social History Narrative  . Not on file   Social Determinants of Health   Financial Resource Strain:   . Difficulty of Paying Living Expenses:   Food Insecurity:   . Worried About Charity fundraiser in the Last Year:   . Arboriculturist in the Last Year:   Transportation Needs:   . Film/video editor (Medical):   Marland Kitchen Lack of Transportation (Non-Medical):   Physical Activity:   . Days of Exercise per Week:   . Minutes of Exercise per Session:   Stress:   . Feeling of Stress :   Social Connections:   . Frequency of Communication with Friends and Family:   . Frequency of Social Gatherings with Friends and Family:   . Attends Religious Services:   . Active Member of Clubs or Organizations:   . Attends Archivist Meetings:   Marland Kitchen Marital Status:   Intimate Partner Violence:   . Fear of Current or Ex-Partner:   . Emotionally Abused:   Marland Kitchen Physically Abused:   . Sexually Abused:     FAMILY HISTORY: Family History  Problem Relation Age of Onset  . Cancer Mother   . Diabetes Father     REVIEW OF SYSTEMS: Reviewed with the patient as per History of present illness. Psych: Patient denies having dental phobia.  DENTAL HISTORY: CHIEF  COMPLAINT: Patient referred by Dr. Isidore Moos for dental consultation.  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  Patient with anticipated radiation therapy.  Patient is now seen as part of a medically necessary preradiation therapy dental protocol examination.  The patient currently denies acute toothaches, swellings, or abscesses.  Patient was last seen by her primary dentist, Dr. Laurena Bering, on 11/21/2019. The patient was then referred to an endodontist for root canal therapy on tooth #14.  Patient saw Dr. Starla Link for the root  canal therapy on 11/23/2019.  Patient was last seen by Dr. Satira Sark for periodontal maintenance procedure on 07/13/2019.  The patient usually alternates periodontal therapy with the periodontist and with her primary dentist.  The patient's last dental cleaning with her primary dentist, Dr. Laurena Bering, was on 12/08/2017 secondary to Covid precautions.  Patient denies having partial dentures.  Patient denies having dental phobia.  DENTAL EXAMINATION: GENERAL: The patient is well-developed, well-nourished female in no acute distress.  Patient presents in a wheelchair. HEAD AND NECK: There is no palpable neck lymphadenopathy.  The patient denies acute TMJ symptoms. INTRAORAL EXAM: Patient has normal saliva. The patient has buccal exostoses in the areas of 2-3, 14-15, 18-19 and 30-31. The patient has a deep palatal vault. DENTITION: Patient is missing tooth numbers 1, 2, 4, 13, 16, 17, 21, 28, and 32.  Spaces between the premolars have been closed secondary to drifting. Patient denies having had orthodontic therapy. PERIODONTAL: Patient has chronic periodontitis with plaque and calculus accumulations, gingival recession, and incipient to moderate bone loss.  Tooth mobility is noted as per dental charting form.  Patient is undergoing active periodontal maintenance procedures with Dr. Satira Sark and her primary dentist, Dr.  Laurena Bering. DENTAL CARIES/SUBOPTIMAL RESTORATIONS: Dental caries are noted per dental charting form. ENDODONTIC: The patient currently denies acute pulpitis symptoms.  Patient recently had a root canal therapy on tooth #14 with Dr. Starla Link at [facility] endodontics.  Patient also has a previous root canal therapy associated with tooth #15. CROWN AND BRIDGE: Patient has multiple crown restorations on tooth numbers 12, 14, 15, 19, 29, 30, and 31.  There appear to be recurrent caries at the crown margins of tooth numbers 30 and 31. PROSTHODONTIC: The patient denies having partial dentures. OCCLUSION: Patient has a poor occlusal scheme but a stable occlusion.  RADIOGRAPHIC INTERPRETATION: An orthopantogram was taken and is suboptimal secondary to patient refusing to remove her earrings. The orthopantogram was supplemented with a full series of dental radiographs. There are multiple missing teeth.  There is supra eruption and drifting of the unopposed teeth into the edentulous areas.  There is incipient a moderate bone loss.  Dental caries are noted.  There are previous root canal therapy associated with tooth numbers 14 and 15.  Multiple crown restorations are noted.  Several of the crowns appear to have recurrent caries at the margins.   ASSESSMENTS: 1.  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  Preradiation therapy dental protocol 3.  Chronic periodontitis with bone loss 4.  Gingival recession 5.  Accretions. 6.  Tooth mobility 7.  Dental caries 8.  Suboptimal dental restorations 9.  Multiple missing teeth 10.  Deep overbite 11.  Multiple malpositioned teeth 12. Poor occlusal scheme and malocclusion  13. Buccal exostoses 14.  Risk for osteonecrosis of the jaw with invasive dental procedures due to previous Fosamax therapy 15. LATEX ALLERGY    PLAN/RECOMMENDATIONS: 1. I discussed the risks, benefits, and complications of various treatment options with the patient in relationship to  her medical and dental conditions, anticipated radiation therapy, and radiation therapy side effects to include xerostomia, radiation caries, trismus, mucositis, taste changes, gum and jawbone changes, and risk for infection and osteoradionecrosis.  We also discussed the possibility of osteonecrosis of the jaw with invasive dental procedures related to previous oral bisphosphonate therapy.  We discussed various treatment options to include no treatment, extraction of teeth in the primary field of radiation therapy with alveoloplasty, pre-prosthetic surgery as indicated, periodontal therapy, dental restorations,  root canal therapy, crown and bridge therapy, implant therapy, and replacement of missing teeth as indicated.  We also discussed fabrication of fluoride trays and scatter protection devices.  The patient currently wishes to proceed with impressions today for the fabrication of scatter protection devices.  Patient does not wish to have fluoride trays fabricated at this time and will apply the fluoride therapy with a toothbrush at bedtime.  Patient will consider extractions of indicated teeth based upon upcoming provision of radiation ports and doses from Dr. Isidore Moos after her consultation.  Patient will then be referred to an oral surgeon for second opinion evaluation for extraction of indicated teeth at that time.  A prescription for Prevident 5000 was sent to her [facility] pharmacy with refills for 1 year. Brush on instructions were provided.   2. Discussion of findings with medical team and coordination of future medical and dental care as needed.  I spent in excess of 120 minutes during the conduct of this consultation and >50% of this time involved direct face-to-face encounter for counseling and/or coordination of the patient's care.    Lenn Cal, DDS

## 2020-01-03 NOTE — Patient Instructions (Signed)

## 2020-01-04 ENCOUNTER — Other Ambulatory Visit: Payer: Self-pay | Admitting: Radiation Oncology

## 2020-01-04 ENCOUNTER — Encounter (HOSPITAL_COMMUNITY): Payer: Self-pay | Admitting: Dentistry

## 2020-01-04 DIAGNOSIS — C051 Malignant neoplasm of soft palate: Secondary | ICD-10-CM

## 2020-01-10 ENCOUNTER — Telehealth: Payer: Self-pay | Admitting: *Deleted

## 2020-01-10 NOTE — Telephone Encounter (Signed)
CALLED PATIENT TO INFORM OF PET SCAN FOR 01-12-20 - ARRIVAL TIME- 12:30 PM @ [facility], PATIENT TO HAVE WATER ONLY- 6 HRS. PRIOR TO TEST, PATIENT TO NOT HAVE GUM OR CANDY AND SHE IS TO AVOID CARBS LAST MEAL PRIOR TO TEST, SPOKE WITH PATIENT AND SHE IS AWARE OF THIS TEST

## 2020-01-10 NOTE — Progress Notes (Signed)
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 She was seen in March and placed on nystatin. When she returned for follow up in June, this area had worsened and biopsy was recommended. 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 She underwent punch biopsy on 06/03/20211 which showed squamous cell carcinoma with focal superficial invasion.    Biopsies revealed: 11/09/2019   10/04/2014   Nutrition Status Yes No Comments  Weight changes? []  [x]    Swallowing concerns? []  [x]    PEG? []  [x]     Referrals Yes No Comments  Social Work? [x]  []    Dentistry? [x]  []  01/03/2020 Saw Dr. Teena Dunk  Swallowing therapy? [x]  []    Nutrition? [x]  []    Med/Onc? []  [x]     Safety Issues Yes No Comments  Prior radiation? []  [x]    Pacemaker/ICD? []  [x]    Possible current pregnancy? []  [x]    Is the patient on methotrexate? []  [x]     Tobacco/Marijuana/Snuff/ETOH use:  She has smoked since the age of 44 and is now down to 3 cigarettes per day; She will occasionally have 1/2 a beer once a month; She denies any smokeless tobacco or illicit drug use.   Past/Anticipated interventions by otolaryngology, if any:  12/07/2019 Dr. Fenton Malling  Punch biopsy into left soft palate 11/09/2019 Dr. Fenton Malling Biopsy left posterior buccal mucosa 10/04/2014 Dr. Fenton Malling Palate lesion biopsy  Past/Anticipated interventions by medical oncology, if any:  No referral made as of yet. Per Dr. Thea Silversmith: "No role at this time. If she is found to have lymphadenopathy, we would consult medical oncology. Given her functional limitations due to COPD, I am not sure that she would be a great candidate for chemotherapy. "  Current Complaints / other details:   Patient has significant issues with  claustrophobia. She also has chronic back pain that she's worried about being able to lie flat for her PET scan on 01/12/20 and then on the table for radiation

## 2020-01-11 ENCOUNTER — Ambulatory Visit
Admission: RE | Admit: 2020-01-11 | Discharge: 2020-01-11 | Disposition: A | Discharge status: Home / Self Care | Payer: Medicare Other | Source: Ambulatory Visit | Attending: Radiation Oncology | Admitting: Radiation Oncology

## 2020-01-11 ENCOUNTER — Encounter: Payer: Self-pay | Admitting: Radiation Oncology

## 2020-01-11 ENCOUNTER — Other Ambulatory Visit: Payer: Self-pay

## 2020-01-11 VITALS — BP 110/67 | HR 71 | Temp 97.6°F | Resp 19 | Wt 121.0 lb

## 2020-01-11 DIAGNOSIS — F17210 Nicotine dependence, cigarettes, uncomplicated: Secondary | ICD-10-CM | POA: Insufficient documentation

## 2020-01-11 DIAGNOSIS — C051 Malignant neoplasm of soft palate: Secondary | ICD-10-CM | POA: Diagnosis present

## 2020-01-11 MED ORDER — LARYNGOSCOPY SOLUTION RAD-ONC
15.0000 mL | Freq: Once | TOPICAL | Status: AC
  Administered 2020-01-11: 15 mL via TOPICAL
  Filled 2020-01-11: qty 15

## 2020-01-11 MED ORDER — LORAZEPAM 0.5 MG PO TABS: ORAL_TABLET | ORAL | 0 refills | Status: DC

## 2020-01-11 NOTE — Progress Notes (Addendum)
Radiation Oncology         ([phone]) [phone] ________________________________  Initial outpatient Consultation  Name: Deanna Burns MRN: [id number]  Date: 01/11/2020  DOB: January 11, 1946  [id number], Christean Grief, MD  Thea Silversmith, MD   REFERRING PHYSICIAN: Thea Silversmith, MD  DIAGNOSIS:    ICD-10-CM   1. 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 Cancer of soft palate (HCC)  C05.1    Cancer Staging Cancer of soft palate ([place]) Staging form: Pharynx - P16 Negative Oropharynx, AJCC 8th Edition - Clinical stage from 01/11/2020: Stage II (cT2, cN0, cM0) - Signed by Eppie Gibson, MD on 01/13/2020 T1-T2N0M0 Stage I-II  CHIEF COMPLAINT: Here to discuss management of soft palate cancer  HISTORY OF PRESENT ILLNESS::Deanna Burns is a 74 y.o. 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 She was later found to have atypical keratinocytic neoplasm on pathology from the palate from 07/2014  Subsequently, the patient saw Dr. Vicie Mutters in 08/2014 who performed a biopsy of the palate lesion. Pathology from the procedure showed focal moderate to severe squamous epithelial dysplasia. She has been followed by Dr. Vicie Mutters since that time.   More recently, she returned for follow up in 09/2019 with worsening of the lesion. She was placed on nystatin with little improvement. She underwent repeat biopsy of the soft palate lesion on 11/09/2019 showing: severe squamous dysplasia; carcinoma in situ. She proceeded to punch biopsy of the left palate on 12/08/2019, and pathology showed squamous cell carcinoma with focal superficial invasion.  Pertinent imaging thus far includes staging CT scans of head, neck/thyroid, and chest performed on 12/23/2019 revealing no adenopathy or evidence of metastatic disease.   I personally reviewed her imaging with the tumor board.  The lesion in the  soft palate is not well defined on CT. There is artifact from her metallic fillings.  Swallowing issues, if any: Denies  Weight Changes: Denies  Other symptoms: She reports significant weakness, fatigue, and breathing problems related to her COPD  Tobacco history, if any: current smoker, since age 21, down to 3 cigarettes per day  ETOH abuse, if any: She drinks alcohol occasionally.  She used to smoke more heavily but now is down to 3 cigarettes/day.  She has chronic back pain.  She has claustrophobia.  PET scan is pending for tomorrow.  She reports that she is not sure if she wants to proceed with radiotherapy given the potential side effects and her comorbidities.  Dr. Vicie Mutters of otolaryngology felt that radiotherapy was the best option for her, in lieu of surgery.  She was previously seen by Dr. Pablo Ledger in [facility] and referred here to receive her treatment closer to home   PREVIOUS RADIATION THERAPY: No  PAST MEDICAL HISTORY:  has a past medical history of Asthma, COPD (chronic obstructive pulmonary disease) ([place]), Hypertension, and Nosebleed.    PAST SURGICAL HISTORY: Past Surgical History:  Procedure Laterality Date  . ABDOMINAL HYSTERECTOMY    . BACK SURGERY      FAMILY HISTORY: family history includes Cancer in her mother; Diabetes in her father.  SOCIAL HISTORY:  reports that she has been smoking cigarettes. She has never used smokeless tobacco. She reports current alcohol use. She reports that she does not use drugs.  ALLERGIES: Aspirin, Latex, Penicillins, and Sulfa antibiotics  MEDICATIONS:  Current Outpatient Medications  Medication  Sig Dispense Refill  . albuterol (PROVENTIL HFA;VENTOLIN HFA) 108 (90 BASE) MCG/ACT inhaler Inhale 2 puffs into the lungs every 6 (six) hours as needed. For shortness of breath.    . ALPRAZolam (XANAX) 0.25 MG tablet Take 0.125 mg by mouth every morning.     Marland Kitchen aluminum hydroxide-magnesium carbonate (GAVISCON) 95-358 MG/15ML SUSP Take 15  mLs by mouth as needed.    . bisoprolol (ZEBETA) 5 MG tablet Take 2.5 mg by mouth daily.     . cycloSPORINE (RESTASIS) 0.05 % ophthalmic emulsion Place 1 drop into both eyes 2 (two) times daily.    Marland Kitchen ibuprofen (ADVIL,MOTRIN) 200 MG tablet Take 200-400 mg by mouth every 6 (six) hours as needed. For pain.    . montelukast (SINGULAIR) 10 MG tablet Take 10 mg by mouth at bedtime.      . polyethylene glycol (MIRALAX / GLYCOLAX) 17 g packet Take 17 g by mouth daily as needed.    Marland Kitchen Umeclidinium-Vilanterol (ANORO ELLIPTA IN) Inhale into the lungs.    Marland Kitchen HYDROcodone-acetaminophen (NORCO) 5-325 MG tablet Take 1-2 tablets by mouth every 6 (six) hours as needed. (Patient not taking: Reported on 01/03/2020) 12 tablet 0  . LORazepam (ATIVAN) 0.5 MG tablet Take 1 tablet 30 min before PET scan or Radiotherapy PRN claustrophobia. 7 tablet 0  . sodium fluoride (PREVIDENT 5000 PLUS) 1.1 % CREA dental cream Apply to tooth brush. Brush teeth for 2 minutes. Spit out excess-DO NOT swallow. DO NOT rinse afterwards. Repeat nightly. (Patient not taking: Reported on 01/11/2020) 51 g prn   No current facility-administered medications for this encounter.    REVIEW OF SYSTEMS:  Notable for that above.   PHYSICAL EXAM:  weight is 121 lb (54.9 kg). Her oral temperature is 97.6 F (36.4 C). Her blood pressure is 110/67 and her pulse is 71. Her respiration is 19 and oxygen saturation is 93%.   General: Alert and oriented, in no acute distress HEENT: Head is normocephalic. Extraocular movements are intact. Oropharynx is notable for erythematous irregular tissue extending from the left soft palate to the left tonsillar fossa and lower retromolar trigone.  It is difficult to ascertain how much of this tissue is related to invasive disease versus dysplasia versus biopsy change.  Irregular tissue extends over 2 cm.  It does not cross midline, no significant trismus. Neck: Neck is notable for no neck adenopathy in the cervical or  supraclavicular regions Heart: Regular in rate and rhythm with no murmurs, rubs, or gallops. Chest: no rhonchi, + scattered wheezes  Abdomen: Soft, nontender, nondistended, with no rigidity or guarding. Extremities: No cyanosis or edema. Lymphatics: see Neck Exam Skin: No concerning lesions. Psychiatric: Judgment and insight are intact. Affect is appropriate.  PROCEDURE NOTE: After obtaining consent and anesthetizing the nasal cavity with topical lidocaine and phenylephrine, the flexible endoscope was introduced and passed through the nasal cavity.  No additional lesions were appreciated over the base of tongue, pharynx, or larynx.  True vocal cords are symmetrically mobile without any nodularity.   ECOG = 2  0 - Asymptomatic (Fully active, able to carry on all predisease activities without restriction)  1 - Symptomatic but completely ambulatory (Restricted in physically strenuous activity but ambulatory and able to carry out work of a light or sedentary nature. For example, light housework, office work)  2 - Symptomatic, <50% in bed during the day (Ambulatory and capable of all self care but unable to carry out any work activities. Up and about more than 50%  of waking hours)  3 - Symptomatic, >50% in bed, but not bedbound (Capable of only limited self-care, confined to bed or chair 50% or more of waking hours)  4 - Bedbound (Completely disabled. Cannot carry on any self-care. Totally confined to bed or chair)  5 - Death   Eustace Pen MM, Creech RH, Tormey DC, et al. [phone]). "Toxicity and response criteria of the [facility]". [place] Oncol. 5 (6): 649-55   LABORATORY DATA:  Lab Results  Component Value Date   WBC 10.0 02/14/2019   HGB 16.0 (H) 02/14/2019   HCT 49.4 (H) 02/14/2019   MCV 97.2 02/14/2019   PLT 330 02/14/2019   CMP     Component Value Date/Time   NA 132 (L) 02/14/2019 1244   K 4.0 02/14/2019 1244   CL 95 (L) 02/14/2019 1244   CO2 25  02/14/2019 1244   GLUCOSE 118 (H) 02/14/2019 1244   BUN 16 02/14/2019 1244   CREATININE 0.60 02/14/2019 1244   CREATININE 0.64 12/02/2011 1026   CALCIUM 9.5 02/14/2019 1244   PROT 8.6 (H) 02/14/2019 1244   ALBUMIN 5.2 (H) 02/14/2019 1244   AST 23 02/14/2019 1244   ALT 19 02/14/2019 1244   ALKPHOS 65 02/14/2019 1244   BILITOT 0.6 02/14/2019 1244   GFRNONAA >60 02/14/2019 1244   GFRNONAA >60 12/02/2011 1026   GFRAA >60 02/14/2019 1244   GFRAA >60 12/02/2011 1026      No results found for: TSH (in Care Everywhere TSH was within normal limits 2 months ago)   RADIOGRAPHY: NM PET Image Initial (PI) Skull Base To Thigh  Result Date: 01/13/2020 CLINICAL DATA:  Initial treatment strategy for head and neck carcinoma. Carcinoma soft palate. EXAM: NUCLEAR MEDICINE PET SKULL BASE TO THIGH TECHNIQUE: 6.7 mCi F-18 FDG was injected intravenously. Full-ring PET imaging was performed from the skull base to thigh after the radiotracer. CT data was obtained and used for attenuation correction and anatomic localization. Fasting blood glucose: 93 mg/dl COMPARISON:  Outside neck CT 12/22/2019 FINDINGS: Mediastinal blood pool activity: SUV max 2.74 Liver activity: SUV max NA NECK: Asymmetric hypermetabolic activity localizing to the posterior superolateral LEFT oropharynx with SUV max equal 8.6. (Image 24). Activity appears confined to the pharyngeal mucosa. Activity does approaches the medial aspect of the LEFT master muscle but does not appear to involve muscle. No hypermetabolic cervical lymph nodes. No hypermetabolic level I lymph nodes. Incidental CT findings: none CHEST: No hypermetabolic mediastinal or hilar nodes. No suspicious pulmonary nodules on the CT scan. Incidental CT findings: Centrilobular emphysema in the upper lobes. Calcified pulmonary nodule RIGHT lower lobe. ABDOMEN/PELVIS: No abnormal hypermetabolic activity within the liver, pancreas, adrenal glands, or spleen. No hypermetabolic lymph nodes in  the abdomen or pelvis. Incidental CT findings: Gallstones present. Atherosclerotic calcification of the aorta. SKELETON: No focal hypermetabolic activity to suggest skeletal metastasis. Incidental CT findings: none IMPRESSION: 1. Hypermetabolic activity in the posterior superolateral LEFT oropharynx as described above. Activity appears confined to the pharyngeal mucosa. This corresponds with given history soft palate carcinoma. 2. No metastatic lymph nodes identified in the neck. 3. No evidence distant metastatic disease Electronically Signed   By: Suzy Bouchard M.D.   On: 01/13/2020 11:10      IMPRESSION/PLAN:  This is a delightful patient with head and neck cancer.  She has been evaluated by otolaryngology and referred for consideration of radiotherapy.  I recommend definitive radiotherapy for this patient.  She does express reservations about pursuing treatment  if it has significant side effects.  She is pondering not doing anything; I let her know that I will call her once her PET results are available to see where she is leaning.  We discussed the potential risks, benefits, and side effects of radiotherapy. We talked in detail about acute and late effects. We discussed that some of the most bothersome acute effects may be mucositis, dysgeusia, salivary changes, skin irritation, hair loss, dehydration, weight loss and fatigue. We talked about late effects which include but are not necessarily limited to dysphagia, hypothyroidism, nerve injury, spinal cord injury, xerostomia, trismus, and neck edema. No guarantees of treatment were given. A consent form was signed and placed in the patient's medical record.    Simulation (treatment planning) will take place upon release by dentistry in conversation with the patient about her wishes (still pondering whether to proceed with treatment).  We also discussed that the treatment of head and neck cancer is a multidisciplinary process to maximize treatment  outcomes and quality of life. For this reason the following referrals have been or will be made:   Dentistry for dental evaluation, possible extractions in the radiation fields, and /or advice on reducing risk of cavities, osteoradionecrosis, or other oral issues.   Nutritionist for nutrition support during and after treatment.   Speech language pathology for swallowing and/or speech therapy.   Social work for social support.    Physical therapy due to risk of lymphedema in neck and deconditioning.  (Addendum: PET scan results were reviewed 2 days after consultation.  I called the patient with the results.  There is hypermetabolic activity corresponding to where I saw her regular tissue on her physical exam.  I have communicated with her dentist about where significant radiation will overlap her posterior tooth roots in the left maxillary and mandibular regions.  Anticipate extractions prior to starting radiotherapy.  The patient expects to be contacted by dentistry in the near future.  Our navigator will arrange CT simulation after dental extractions are complete.  The patient has thought about our discussion and despite her initial reservations regarding radiotherapy, she is leaning towards proceeding.  We will finalize whether to pursue a 4-week hypofractionated course for 7-week standard course when she undergoes simulation.  Due to her heightened concerns about tolerance of side effects and discomfort, a 4-week hypofractionated course may be the most prudent option for her.)  On date of service, in total, I spent 60 minutes on this encounter. Patient was seen in person.  __________________________________________   Eppie Gibson, MD   This document serves as a record of services personally performed by Eppie Gibson, MD. It was created on her behalf by Wilburn Mylar, a trained medical scribe. The creation of this record is based on the scribe's personal observations and the provider's  statements to them. This document has been checked and approved by the attending provider.

## 2020-01-11 NOTE — Progress Notes (Signed)
Oncology Nurse Navigator Documentation  Met with patient during initial consult with Dr. Isidore Moos.  She was accompanied by her long term significant other Merleen Milliner. . Further introduced myself as his Navigator, explained my role as a member of the Care Team.   . Provided New Patient Information packet, discussed contents: o Contact information for physician(s), myself, other members of the Care Team. o Advance Directive information ([place] blue pamphlet with LCSW contact info); provided [facility] AD booklet at his request, encouraged him to contact Gwinda [place] LCSW to complete. o Fall Prevention Patient [place] campus map with highlight of [place] o SLP information sheet o Symptom Management Clinic information . Provided introductory explanation of radiation treatment including SIM planning and purpose of Aquaplast head and shoulder mask, showed them example.   . Provided a tour of SIM and [place] areas, explained treatment and arrival procedures. . I encouraged them to contact me with questions/concerns as treatments/procedures begin.  They verbalized understanding of information provided.    Harlow Asa, RN, BSN Head & Neck Oncology Nurse [place] at [place] [phone]

## 2020-01-12 ENCOUNTER — Ambulatory Visit (HOSPITAL_COMMUNITY)
Admission: RE | Admit: 2020-01-12 | Discharge: 2020-01-12 | Disposition: A | Discharge status: Home / Self Care | Payer: Medicare Other | Source: Ambulatory Visit | Attending: Radiation Oncology | Admitting: Radiation Oncology

## 2020-01-12 DIAGNOSIS — C051 Malignant neoplasm of soft palate: Secondary | ICD-10-CM | POA: Insufficient documentation

## 2020-01-12 LAB — GLUCOSE, CAPILLARY: Glucose-Capillary: 93 mg/dL (ref 70–99)

## 2020-01-12 MED ORDER — FLUDEOXYGLUCOSE F - 18 (FDG) INJECTION
6.7000 | Freq: Once | INTRAVENOUS | Status: AC | PRN
  Administered 2020-01-12: 6.7 via INTRAVENOUS

## 2020-01-13 ENCOUNTER — Ambulatory Visit
Admission: RE | Admit: 2020-01-13 | Discharge: 2020-01-13 | Disposition: A | Discharge status: Home / Self Care | Payer: Self-pay | Source: Ambulatory Visit | Attending: Radiation Oncology | Admitting: Radiation Oncology

## 2020-01-13 ENCOUNTER — Other Ambulatory Visit: Payer: Self-pay | Admitting: Radiation Oncology

## 2020-01-13 ENCOUNTER — Other Ambulatory Visit: Payer: Self-pay

## 2020-01-13 ENCOUNTER — Telehealth: Payer: Self-pay | Admitting: Nutrition

## 2020-01-13 ENCOUNTER — Encounter: Payer: Self-pay | Admitting: Radiation Oncology

## 2020-01-13 DIAGNOSIS — C051 Malignant neoplasm of soft palate: Secondary | ICD-10-CM

## 2020-01-13 NOTE — Progress Notes (Signed)
Dental Form with Estimates of Radiation Dose      Diagnosis:    ICD-10-CM   1. 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 Cancer of soft palate (HCC)  C05.1     Prognosis: curative  Anticipated # of fractions: 20-35    Daily?: yes  # of weeks of radiotherapy: 4-7 (might hypofractionate)   Chemotherapy?: no  Anticipated xerostomia:  Mild permanent   Pre-simulation needs:  Scatter protection  Simulation: ASAP   Other Notes: I left a voicemail / inbasket message, too, for Dr Enrique Sack. Please contact Eppie Gibson, MD, with patient's disposition after evaluation and/or dental treatment.

## 2020-01-13 NOTE — Telephone Encounter (Signed)
Scheduled appt per 7/9 sch msg for nutritionist. Pt is aware.

## 2020-01-17 ENCOUNTER — Encounter: Payer: Self-pay | Admitting: *Deleted

## 2020-01-17 NOTE — Progress Notes (Signed)
[place] Work  Clinical Social work received referral from radiation oncology.  CSW contacted patient at home to offer support and assess for needs.  CSW left a messaging offering support and encouraging patient to return call.  Johnnye Lana, MSW, LCSW, OSW-C Clinical Social Worker [facility] [phone]

## 2020-01-18 ENCOUNTER — Telehealth: Payer: Self-pay | Admitting: General Practice

## 2020-01-18 NOTE — Telephone Encounter (Signed)
[place] CSW Progress Notes  Second call to patient to address social work referral - no answer, left detailed VM w information about [place] services and how to contact us if she desires.  Will close referral at this time and plan to meet w patient during Head and Neck MDC when scheduled.  Edwyna Shell, LCSW Clinical Social Worker Phone:  [phone] Cell:  [phone]

## 2020-01-23 ENCOUNTER — Inpatient Hospital Stay: Payer: Medicare Other | Attending: Radiation Oncology | Admitting: Nutrition

## 2020-01-23 NOTE — Progress Notes (Signed)
74 year old female diagnosed with soft palate cancer.  She is followed by Dr. Isidore Moos and plans are for radiation therapy.  Past medical history includes COPD, asthma, and hypertension.  Medications include Ativan, Xanax, and MiraLAX.  Labs were reviewed.  Height: 5 feet 3 inches. Weight: 121 pounds on July 7. Patient weighed 122 pounds August 2020 and 140 pounds in August 2018. BMI: 21.43.  (within normal limits.)  I spoke with patient over the phone. She denies nutrition impact symptoms.  Reports she chews and swallows well and she has no nausea, vomiting, constipation or diarrhea. Reports that she has fatigue but that is nothing new.  States she has had fatigue for years. Reports she has a copy of nutrition information and my contact information which was given to her previously. Reports lactose intolerance.  Nutrition diagnosis: Food and nutrition related knowledge deficit related to new diagnosis of soft palate cancer and associated treatments as evidenced by no prior need for nutrition related information.  Intervention: Educated patient on the importance of consuming smaller more frequent meals and snacks with high-protein and high-calorie foods. Stressed importance of weight maintenance. Encourage patient explore oral nutrition supplements. Teach back method used.  Monitoring, evaluation, goals: Patient will tolerate adequate calories and protein for minimal weight loss.  Next visit: To be scheduled with treatment weekly.  **Disclaimer: This note was dictated with voice recognition software. Similar sounding words can inadvertently be transcribed and this note may contain transcription errors which may not have been corrected upon publication of note.**

## 2020-02-10 ENCOUNTER — Ambulatory Visit
Admission: RE | Admit: 2020-02-10 | Discharge: 2020-02-10 | Disposition: A | Discharge status: Home / Self Care | Payer: Medicare Other | Source: Ambulatory Visit | Attending: Radiation Oncology | Admitting: Radiation Oncology

## 2020-02-10 ENCOUNTER — Other Ambulatory Visit: Payer: Self-pay

## 2020-02-10 ENCOUNTER — Ambulatory Visit (HOSPITAL_COMMUNITY): Payer: Medicare Other | Admitting: Dentistry

## 2020-02-10 VITALS — BP 148/72 | HR 83 | Temp 98.4°F

## 2020-02-10 DIAGNOSIS — Z01818 Encounter for other preprocedural examination: Secondary | ICD-10-CM

## 2020-02-10 DIAGNOSIS — C051 Malignant neoplasm of soft palate: Secondary | ICD-10-CM | POA: Diagnosis present

## 2020-02-10 DIAGNOSIS — Z510 Encounter for antineoplastic radiation therapy: Secondary | ICD-10-CM | POA: Diagnosis not present

## 2020-02-10 DIAGNOSIS — Z463 Encounter for fitting and adjustment of dental prosthetic device: Secondary | ICD-10-CM

## 2020-02-10 NOTE — Progress Notes (Signed)
02/10/2020  Patient Name:   Deanna Burns Date of Birth:   1946-05-27 Medical Record Number: [id number]  BP (!) 148/72    Pulse 83    Temp 98.4 F (36.9 C)   [name] now presents for insertion of upper and lower scatter protection devices. Patient had 2 molar teeth extracted by Dr. Benson Norway on Wednesday, 02/08/2020. Patient with some discomfort from dental extraction sites.  Patient does have some left facial swelling by report.  Patient agrees to proceed with insertion of scatter protection devices today.  PROCEDURE: Appliances were tried in and adjusted as needed. [place] Trismus device was previously fabricated 37 mm using 19 sticks. Postop instructions were provided and a written and verbal format concerning the use and care of appliances. All questions were answered. Patient to call for periodic oral examination in approximately 3-4 weeks during radiation therapy if needed. Patient to follow-up with Dr. Benson Norway for evaluation of healing as instructed by Dr. Benson Norway. Patient to call if questions or problems arise before then.   Lenn Cal, DDS

## 2020-02-10 NOTE — Progress Notes (Signed)
Oncology Nurse Navigator Documentation  To provide support, encouragement and care continuity, met with Ms. Deanna Burns during her CT SIM. She was accompanied by her husband.   She tolerated procedure without difficulty, denied questions/concerns.    .   I encouraged them to call me prior to 8/18 [place].  Harlow Asa RN, BSN, OCN Head & Neck Oncology Nurse [place] at [facility] Phone # [phone]  Fax # [phone]

## 2020-02-20 DIAGNOSIS — Z510 Encounter for antineoplastic radiation therapy: Secondary | ICD-10-CM | POA: Diagnosis not present

## 2020-02-22 ENCOUNTER — Ambulatory Visit
Admission: RE | Admit: 2020-02-22 | Discharge: 2020-02-22 | Disposition: A | Discharge status: Home / Self Care | Payer: Medicare Other | Source: Ambulatory Visit | Attending: Radiation Oncology | Admitting: Radiation Oncology

## 2020-02-22 ENCOUNTER — Other Ambulatory Visit: Payer: Self-pay

## 2020-02-22 ENCOUNTER — Inpatient Hospital Stay: Payer: Medicare Other | Attending: Radiation Oncology | Admitting: Nutrition

## 2020-02-22 DIAGNOSIS — Z510 Encounter for antineoplastic radiation therapy: Secondary | ICD-10-CM | POA: Diagnosis not present

## 2020-02-22 NOTE — Progress Notes (Signed)
Oncology Nurse Navigator Documentation  To provide support, encouragement and care continuity, met with Deanna Burns for her initial RT.  She was accompanied by her significant other Deanna Burns.   I reviewed the 2-step treatment process, answered questions.   Deanna Burns completed treatment without difficulty, denied questions/concerns.  I reviewed the registration/arrival procedure for subsequent treatments.  I then assisted them to Deanna Burns's appointment with Dory Peru RD.  I also discussed her attendance for [facility] 8/19. She plans to attend.  I encouraged them to call me with questions/concerns as tmts proceed.  Harlow Asa RN, BSN, OCN Head & Neck Oncology Nurse [place] at [facility] Phone # [phone]  Fax # [phone]

## 2020-02-22 NOTE — Progress Notes (Signed)
Nutrition follow-up completed with patient after her radiation therapy for soft palate cancer. Patient is status post dental extractions and reports she is healing. She denies nutrition impact symptoms at this time. Reports her weight at home is documented as 122 pounds.  She weighs once a week.  Nutrition diagnosis: Food and nutrition related knowledge deficit continues.  Intervention: Provided additional education on strategies for weight maintenance during treatment. A feeding tube is not planned at this time. Encourage small frequent high-calorie high-protein foods. Recommended patient consume 1-2 bottles of oral nutrition supplements daily with snacks.  I provided samples. Questions were answered.  Teach back method used.  Contact information provided.  Monitoring, evaluation, goals:  Patient will tolerate adequate calories and protein to minimize weight loss.  Next visit: Wednesday, August 25 after radiation therapy.  **Disclaimer: This note was dictated with voice recognition software. Similar sounding words can inadvertently be transcribed and this note may contain transcription errors which may not have been corrected upon publication of note.**

## 2020-02-23 ENCOUNTER — Other Ambulatory Visit: Payer: Self-pay

## 2020-02-23 ENCOUNTER — Ambulatory Visit: Payer: Medicare Other | Attending: Radiation Oncology | Admitting: Physical Therapy

## 2020-02-23 ENCOUNTER — Encounter: Payer: Self-pay | Admitting: Physical Therapy

## 2020-02-23 ENCOUNTER — Ambulatory Visit: Payer: Medicare Other

## 2020-02-23 ENCOUNTER — Encounter: Payer: Self-pay | Admitting: General Practice

## 2020-02-23 ENCOUNTER — Ambulatory Visit
Admission: RE | Admit: 2020-02-23 | Discharge: 2020-02-23 | Disposition: A | Discharge status: Home / Self Care | Payer: Medicare Other | Source: Ambulatory Visit | Attending: Radiation Oncology | Admitting: Radiation Oncology

## 2020-02-23 VITALS — Wt 122.4 lb

## 2020-02-23 DIAGNOSIS — R1310 Dysphagia, unspecified: Secondary | ICD-10-CM

## 2020-02-23 DIAGNOSIS — Z510 Encounter for antineoplastic radiation therapy: Secondary | ICD-10-CM | POA: Diagnosis not present

## 2020-02-23 DIAGNOSIS — C051 Malignant neoplasm of soft palate: Secondary | ICD-10-CM | POA: Insufficient documentation

## 2020-02-23 DIAGNOSIS — R293 Abnormal posture: Secondary | ICD-10-CM | POA: Diagnosis present

## 2020-02-23 NOTE — Progress Notes (Signed)
Oncology Nurse Navigator Documentation  I met with Deanna Burns and her SO [name] during [facility] today. I assisted her to the appointment and answered questions that they had regarding their appointments today and future appointments. They know to call me if they have any further questions or concerns.   Jennifer Malmfelt RN, BSN, OCN Head & Neck Oncology Nurse Navigator [facility] Phone # [phone]  Fax # [phone] 

## 2020-02-23 NOTE — Patient Instructions (Signed)
SWALLOWING EXERCISES Do these until 6 months after your last day of radiation, then 2-3 times per week afterwards  1. Effortful Swallows - Press your tongue up hard and then swallow HARD - Repeat 10-15 times, 2-3 times a day  2. Masako Swallow   - swallow with your tongue sticking out - Repeat 10-15 times, 2-3 times a day *use a wet spoon if your mouth gets dry*  3. Pitch Raise - "HEEEEEEEE" as high and squeaky as you can get - Repeat 20 times, 2-3 times a day  4. Chin pushback - Put your fist UNDER your chin near your neck and push up without moving your head - hold for 5 seconds - Repeat 10 times, 2-3 times a day

## 2020-02-23 NOTE — Progress Notes (Signed)
[facility] Initial Psychosocial Assessment Clinical Social Work  Clinical Social Work contacted by phone to assess psychosocial, emotional, mental health, and spiritual needs of the patient.   Barriers to care/review of distress screen:  - Transportation:  Do you anticipate any problems getting to appointments?  Do you have someone who can help run errands for you if you need it? Significant other helps w transportation.  During clinic, she is in a wheelchair - she uses this when having to walk any distance due to vertigo. - Help at home:  What is your living situation (alone, family, other)?  If you are physically unable to care for yourself, who would you call on to help you?  Lives w significant other who is supportive and physically able to help w care - Support system:  What does your support system look like?  Who would you call on if you needed some kind of practical help?  What if you needed someone to talk to for emotional support?  Significant other - Finances:  Are you concerned about finances.  Considering returning to work?  If not, applying for disability?   Did not discuss, has Medicare Parts A and B.    What is your understanding of where you are with your cancer? Its cause?  Your treatment plan and what happens next?  74 year old female, diagnosed w Malignant neoplasm of soft palate, in radiation treatment.  Has other comorbid conditions that impact her quality of life including COPD and vertigo.  She is frustrated by the multiple appointments and "things to do" that limit her ability to self manage her symptoms as she has been able to.  For example, early morning appointments are difficult for her since she must take a medication upon awakening to deal w her vertigo.  This morning she was too rushed to eat, thus presents for clinic hungry and nauseous.     What are your worries for the future as you begin treatment for cancer?  How to manage what needs to be done to treat her cancer on top  of what she is already having to manage for her COPD, pain issues (back) and vertigo  What are your hopes and priorities during your treatment? What is important to you? What are your goals for your care?  Appointments scheduled later in the day.  Predictability, allowing her to manage other things in life.    CSW Summary:  Patient and family psychosocial functioning including strengths, limitations, and coping skills:  74 year old female, somewhat frustrated at demands of treatment and how this interferes w other goals in life. Has support from her significant other who accompanies her to appointments and helps her w daily needs .   Identifications of barriers to care:  Preexisting physical difficulties, stress  Availability of community resources: [place] resources  Clinical Social Worker follow up needed: Yes.    Will touch base w patient when she is at [facility] during future visits.  Edwyna Shell, LCSW Clinical Social Worker Phone:  [phone]

## 2020-02-23 NOTE — Therapy (Signed)
[place], [place], [address] Phone: [phone]   Fax:  [phone]  Physical Therapy Evaluation  Patient Details  Name: Deanna Burns MRN: [id number] Date of Birth: August 26, 1945 Referring Provider (PT): Reita May Date: 02/23/2020   PT End of Session - 02/23/20 1058    Visit Number 1    Number of Visits 1    PT Start Time 0929    PT Stop Time 0948    PT Time Calculation (min) 19 min    Activity Tolerance Patient tolerated treatment well    Behavior During Therapy [facility] for tasks assessed/performed           Past Medical History:  Diagnosis Date  . Asthma   . COPD (chronic obstructive pulmonary disease) ([place])   . Hypertension   . Nosebleed     Past Surgical History:  Procedure Laterality Date  . ABDOMINAL HYSTERECTOMY    . BACK SURGERY      There were no vitals filed for this visit.    Subjective Assessment - 02/23/20 1052    Subjective I am hungry today and nauseated and dizzy. It is not a good day.    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 Yes    Pain Score 6     Pain Location Back    Pain Descriptors / Indicators Aching    Pain Type Chronic pain    Pain Onset More than a month ago    Pain Frequency Constant    Aggravating Factors  sitting, staying in same position    Pain Relieving Factors moving around, leaning forward    Effect of Pain on Daily Activities has to change positions              [facility] PT Assessment - 02/23/20 0001      Assessment   Medical Diagnosis squamous cell carcinoma of soft palate    Referring Provider (PT) Isidore Moos    Onset Date/Surgical Date 09/05/19    Hand Dominance Right     Prior Therapy none      Precautions   Precautions Other (comment)    Precaution Comments active cancer      Restrictions   Weight Bearing Restrictions No      Balance Screen   Has the patient fallen in the past 6 months No    Has the patient had a decrease in activity level because of a fear of falling?  No    Is the patient reluctant to leave their home because of a fear of falling?  No      Home Environment   Living Environment Private residence    Living Arrangements Spouse/significant other    Available Help at Discharge Family    Type of [place] to enter    Entrance Stairs-Number of Steps 2    Entrance Stairs-Rails Can reach both    [place] One level      Prior Function   Level of Independence Needs assistance with ADLs   needs assist with washing hair- COPD   Vocation Retired    Leisure does not exercise      Cognition   Overall Cognitive Status  Within Functional Limits for tasks assessed      Functional Tests   Functional tests Sit to Stand      Sit to Stand   Comments 30 sec sit to stand not completed due to pt having nausea and dizziness      Posture/Postural Control   Posture/Postural Control Postural limitations    Postural Limitations Rounded Shoulders;Forward head      ROM / Strength   AROM / PROM / Strength AROM      AROM   Overall AROM  Deficits    Overall AROM Comments decreased bilateral shoulder flexion (50% limited) and abduction (25% limited) due to hx of bilateral rotator cuff tears in 2016    AROM Assessment Site Cervical    Cervical Flexion WFL    Cervical Extension WFL    Cervical - Right Side Bend WFL    Cervical - Left Side Bend WFL    Cervical - Right Rotation WFL    Cervical - Left Rotation [facility]      Ambulation/Gait   Ambulation/Gait No   not assessed per pt request due to dizziness and nausea            LYMPHEDEMA/ONCOLOGY QUESTIONNAIRE - 02/23/20 0001      Treatment   Active Radiation  Treatment Yes      Lymphedema Assessments   Lymphedema Assessments Head and Neck      Head and Neck   4 cm superior to sternal notch around neck 32.6 cm    6 cm superior to sternal notch around neck 35.7 cm    8 cm superior to sternal notch around neck 36.7 cm                   Objective measurements completed on examination: See above findings.               PT Education - 02/23/20 1057    Education Details Neck ROM, importance of posture when sitting, standing and lying down, deep breathing, walking program and importance of staying active throughout treatment, CURE article on staying active, "Why exercise?" flyer, lymphedema and PT info    Person(s) Educated Patient;Other (comment)   significant other Shanon Brow   Methods Explanation;Handout    Comprehension Verbalized understanding                   Head and Neck Clinic Goals - 02/23/20 1105      Patient will be able to verbalize understanding of a home exercise program for cervical range of motion, posture, and walking.    Status Achieved      Patient will be able to verbalize understanding of proper sitting and standing posture.    Status Achieved      Patient will be able to verbalize understanding of lymphedema risk and availability of treatment for this condition.    Status Achieved              Plan - 02/23/20 1058    Clinical Impression Statement Pt presents to head and neck clinic with newly diagnosed squamous cell carcinoma of soft palate. She has begun radiation to bilateral neck. She also has a history of COPD and a back surgery. She reports since her back surgery in 2014 she has not been able to walk long distances and is uncomfortable sitting for long periods of time. She has decreased bilateral shoulder flexion due to bilateral rotator cuff tears since 2016. Pt reports that she is nasuseated, dizzy  and hungry today because she had to wake up so early for her  appointments. Did not test 30 sec sit to stand or ambulation per pt request due to dizziness and nausea. Pt reports dealing with vertigo. Educated pt about signs and symptoms of lymphedema as well as anatomy and physiology of lymphatic system. Educated her in importance of staying as active as possible throughout treatment to decreas fatigue and in head and neck ROM exercises to decrease loss of ROM. Pt has no other skilled needs at this time.    Personal Factors and Comorbidities Comorbidity 3+    Comorbidities hx of back sugery in 2014, hx of bilateral rotator cuff tears 2016, COPD    Examination-Activity Limitations Bathing;Lift;Stairs;Locomotion Level;Stand;Bend;Reach Overhead;Carry;Sit;Dressing    Examination-Participation Restrictions Laundry;Shop;Cleaning;Meal Prep;Community Activity;Yard Work    Stability/Clinical Decision Making Stable/Uncomplicated    Clinical Decision Making Low    Rehab Potential Good    PT Frequency One time visit    PT Treatment/Interventions ADLs/Self Care Home Management;Patient/family education;Therapeutic exercise    PT Next Visit Plan dc this visit    PT Home Exercise Plan head and neck ROM exercises    Consulted and Agree with Plan of Care Patient           Patient will benefit from skilled therapeutic intervention in order to improve the following deficits and impairments:  Postural dysfunction, Pain, Decreased knowledge of precautions  Visit Diagnosis: Abnormal posture  Malignant neoplasm of soft palate [facility])     Problem List Patient Active Problem List   Diagnosis Date Noted  . Cancer of soft palate ([place]) 01/03/2020    Allyson Sabal [facility] 02/23/2020, 11:10 AM  [place], [place], [address] Phone: [phone]   Fax:  [phone]  Name: MALEEYAH MCCAUGHEY MRN: [id number] Date of Birth: 1946-03-12  Manus Gunning, PT 02/23/20 11:11 AM

## 2020-02-23 NOTE — Therapy (Signed)
[place] [address] [place], [place], [address] Phone: [phone]   Fax:  [phone]  Speech Language Pathology Evaluation  Patient Details  Name: Deanna Burns MRN: [id number] Date of Birth: May 29, 1946 Referring Provider (SLP): Eppie Gibson, MD   Encounter Date: 02/23/2020   End of Session - 02/23/20 1139    Visit Number 1    Number of Visits 7    Date for SLP Re-Evaluation 05/23/20    SLP Start Time 0855    SLP Stop Time  0925    SLP Time Calculation (min) 30 min    Activity Tolerance Patient tolerated treatment well           Past Medical History:  Diagnosis Date  . Asthma   . COPD (chronic obstructive pulmonary disease) ([place])   . Hypertension   . Nosebleed     Past Surgical History:  Procedure Laterality Date  . ABDOMINAL HYSTERECTOMY    . 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 In 2016 she was found to have atypical keratinocytic neoplasm on pathology. Biopsy Feb 2016 with showed severe squamous dysplasia, MArch 2021 presented with worsening lesionand biopsies copmleted showing squamous cell carcinoma with focal superficial invasion. In June 2021 CT head revelaing NO adenopathy or mets. Radiation to soft palate and bil neck started 02-22-20 - 20 fx.      Prior Functional Status   Cognitive/Linguistic Baseline Within functional limits      Cognition   Overall Cognitive Status Within Functional Limits for tasks assessed      Auditory Comprehension   Overall Auditory Comprehension Appears within functional limits for tasks assessed      Oral  Motor/Sensory Function   Overall Oral Motor/Sensory Function Appears within functional limits for tasks assessed      Motor Speech   Overall Motor Speech Appears within functional limits for tasks assessed           Pt currently tolerates thin liquids as assessed today. Per pt report, she tolerates regular solids without coughing/choking however SLP unable to assess pt with those items due to pt nausea -pt politely refused solids today. Pt's swallow deemed WNL/WFL at this time, as tested, for thin liquids.   Oral motor assessment revealed WNL lingual ROM and WNL lingual strength. Velar ROM appeared WNL.  Because data states the risk for dysphagia during and after radiation treatment is high due to undergoing radiation tx, SLP taught pt about the possibility of reduced/limited ability for PO intake during rad tx. SLP encouraged pt to continue swallowing POs as far into rad tx as possible, even ingesting POs and/or completing HEP shortly after administration of pain meds. Among other modifications for days when pt cannot functionally swallow, SLP talked about performing only non-swallowing tasks on the handout/HEP, and then adding swallowing tasks back in when it becomes possible to do so.  SLP educated pt re: changes to swallowing musculature after rad tx, and why adherence to dysphagia HEP provided today  and PO consumption was necessary to inhibit muscular disuse atrophy and to reduce muscle fibrosis following rad tx. Pt demonstrated understanding of these things to SLP.    SLP then developed a HEP for pt and pt was instructed how to perform exercises involving lingual, vocal, and pharyngeal strengthening. SLP performed each exercise and pt return demonstrated each exercise. SLP ensured pt performance was correct prior to moving on to next exercise. Pt was instructed to complete this program 2-3 times a day, 6-7 days/week until 6 months after her last rad tx, then x2 a week after  that.                  SLP Education - 02/23/20 1139    Education Details HEP procedure, late effects head/neck radiation on swalow function    Person(s) Educated Patient;Caregiver(s)    Methods Explanation;Demonstration;Verbal cues;Handout    Comprehension Verbalized understanding;Returned demonstration;Verbal cues required;Need further instruction            SLP Short Term Goals - 02/23/20 1151      SLP SHORT TERM GOAL #1   Title pt will complete HEP with rare min A    Time 2    Period --   sessions, for all STGs   Status New      SLP SHORT TERM GOAL #2   Title pt will tell SLP why she is completing HEP    Time 2    Status New      SLP SHORT TERM GOAL #3   Title pt will describe 3 overt s/s aspiration PNA with modified independence    Time 3    Status New      SLP SHORT TERM GOAL #4   Title pt will tell SLP how a food journal could hasten return to a more normalized diet    Time 3    Status New            SLP Long Term Goals - 02/23/20 1152      SLP LONG TERM GOAL #1   Title pt will complete HEP with modified independence over two visits    Time 4    Period --   visits/sessions, for all LTGs   Status New      SLP LONG TERM GOAL #2   Title pt will describe how to modify HEP over time, and the timeline associated with reduction in HEP frequency with modified independence over two sessions    Time 6    Status New            Plan - 02/23/20 1141    Clinical Impression Statement At this time pt swallowing is deemed WNL/WFL with thin liquids. Pt was nauseous during eval today and politely declined other POs, but reports no overt s/sx aspiration with POs - pt's significant other affirmed the same. SLP designed an individualized HEP for dysphagia and pt completed each exercise on their own with usual min-mod demo and verbal cues. There are no overt s/s aspiration reported by pt at this time. Data indicate that pt's swallow ability will likely decrease  over the course of radiation therapy and could very well decline over time following conclusion of their radiation therapy due to muscle disuse atrophy and/or muscle fibrosis. Pt will cont to need to be seen by SLP in order to assess safety of PO intake, assess the need for recommending any objective swallow assessment, and ensuring pt correctly completes the individualized HEP.    Speech  Therapy Frequency --   once every approx 4 weeks   Duration --   7 total sessions (renew after 90 days)   Treatment/Interventions Aspiration precaution training;Pharyngeal strengthening exercises;Diet toleration management by SLP;Trials of upgraded texture/liquids;Patient/family education;SLP instruction and feedback;Compensatory techniques    Potential to Achieve Goals Good    SLP Home Exercise Plan provided today    Consulted and Agree with Plan of Care Patient           Patient will benefit from skilled therapeutic intervention in order to improve the following deficits and impairments:   Dysphagia, unspecified type    Problem List Patient Active Problem List   Diagnosis Date Noted  . Cancer of soft palate ([place]) 01/03/2020    [facility], [place]  02/23/2020, 11:54 AM  [place] [address] [place], [place], [address] Phone: [phone]   Fax:  [phone]  Name: ROYANN WILDASIN MRN: [id number] Date of Birth: 05-Nov-1945

## 2020-02-24 ENCOUNTER — Other Ambulatory Visit: Payer: Self-pay

## 2020-02-24 ENCOUNTER — Ambulatory Visit
Admission: RE | Admit: 2020-02-24 | Discharge: 2020-02-24 | Disposition: A | Discharge status: Home / Self Care | Payer: Medicare Other | Source: Ambulatory Visit | Attending: Radiation Oncology | Admitting: Radiation Oncology

## 2020-02-24 DIAGNOSIS — Z510 Encounter for antineoplastic radiation therapy: Secondary | ICD-10-CM | POA: Diagnosis not present

## 2020-02-27 ENCOUNTER — Ambulatory Visit
Admission: RE | Admit: 2020-02-27 | Discharge: 2020-02-27 | Disposition: A | Discharge status: Home / Self Care | Payer: Medicare Other | Source: Ambulatory Visit | Attending: Radiation Oncology | Admitting: Radiation Oncology

## 2020-02-27 ENCOUNTER — Other Ambulatory Visit: Payer: Self-pay | Admitting: Radiation Oncology

## 2020-02-27 DIAGNOSIS — Z510 Encounter for antineoplastic radiation therapy: Secondary | ICD-10-CM | POA: Diagnosis not present

## 2020-02-27 DIAGNOSIS — C051 Malignant neoplasm of soft palate: Secondary | ICD-10-CM

## 2020-02-27 MED ORDER — LORAZEPAM 0.5 MG PO TABS: ORAL_TABLET | ORAL | 0 refills | Status: AC

## 2020-02-27 MED ORDER — LIDOCAINE VISCOUS HCL 2 % MT SOLN: OROMUCOSAL | 4 refills | Status: DC

## 2020-02-27 MED ORDER — ONDANSETRON HCL 8 MG PO TABS: 8.0000 mg | ORAL_TABLET | Freq: Three times a day (TID) | ORAL | 3 refills | Status: AC | PRN

## 2020-02-27 MED ORDER — SONAFINE EX EMUL
1.0000 "application " | Freq: Two times a day (BID) | CUTANEOUS | Status: DC
  Administered 2020-02-27: 1 via TOPICAL

## 2020-02-27 NOTE — Progress Notes (Signed)
Pt here for patient teaching.    Pt given Radiation and You booklet, Managing Acute Radiation Side Effects for Head and Neck Cancer handout, skin care instructions and Sonafine.    Reviewed areas of pertinence such as fatigue, hair loss, mouth changes, skin changes, throat changes, earaches and taste changes .   Pt able to give teach back of to pat skin, use unscented/gentle soap and drink plenty of water,apply Sonafine bid and avoid applying anything to skin within 4 hours of treatment.   Pt demonstrated understanding and verbalizes understanding of information given and will contact nursing with any questions or concerns.    Http://[email or web address]/whattoexpect/index

## 2020-02-28 ENCOUNTER — Ambulatory Visit
Admission: RE | Admit: 2020-02-28 | Discharge: 2020-02-28 | Disposition: A | Discharge status: Home / Self Care | Payer: Medicare Other | Source: Ambulatory Visit | Attending: Radiation Oncology | Admitting: Radiation Oncology

## 2020-02-28 ENCOUNTER — Other Ambulatory Visit: Payer: Self-pay

## 2020-02-28 DIAGNOSIS — Z510 Encounter for antineoplastic radiation therapy: Secondary | ICD-10-CM | POA: Diagnosis not present

## 2020-02-29 ENCOUNTER — Other Ambulatory Visit: Payer: Self-pay

## 2020-02-29 ENCOUNTER — Inpatient Hospital Stay: Payer: Medicare Other | Admitting: Nutrition

## 2020-02-29 ENCOUNTER — Ambulatory Visit
Admission: RE | Admit: 2020-02-29 | Discharge: 2020-02-29 | Disposition: A | Discharge status: Home / Self Care | Payer: Medicare Other | Source: Ambulatory Visit | Attending: Radiation Oncology | Admitting: Radiation Oncology

## 2020-02-29 DIAGNOSIS — Z510 Encounter for antineoplastic radiation therapy: Secondary | ICD-10-CM | POA: Diagnosis not present

## 2020-02-29 NOTE — Progress Notes (Signed)
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  Patient does not have a feeding tube. Weight decreased and documented as 121 pounds on August 23 stable from 121 pounds July 7. Patient reports she has dry mouth, thick saliva, and some nausea. Reports water does not taste good anymore. She is drinking chocolate boost plus for additional calories and protein. Noted she has Zofran, Ativan, and lidocaine available.  Nutrition diagnosis: Food and nutrition related knowledge deficit continues.  Intervention: Educated patient to increase oral nutrition supplements twice daily between meals. Provided education on strategies for improving dry mouth and thick saliva.  Encouraged baking soda and salt water rinses.  Provided fact sheet. Provided strategies for taste alterations. Questions answered.  Teach back method used.  Monitoring, evaluation, goals: Patient will work to increase calories and protein to minimize weight loss throughout treatment.  Next visit: Wednesday, September 1.  **Disclaimer: This note was dictated with voice recognition software. Similar sounding words can inadvertently be transcribed and this note may contain transcription errors which may not have been corrected upon publication of note.**

## 2020-03-01 ENCOUNTER — Ambulatory Visit
Admission: RE | Admit: 2020-03-01 | Discharge: 2020-03-01 | Disposition: A | Discharge status: Home / Self Care | Payer: Medicare Other | Source: Ambulatory Visit | Attending: Radiation Oncology | Admitting: Radiation Oncology

## 2020-03-01 DIAGNOSIS — Z510 Encounter for antineoplastic radiation therapy: Secondary | ICD-10-CM | POA: Diagnosis not present

## 2020-03-02 ENCOUNTER — Ambulatory Visit
Admission: RE | Admit: 2020-03-02 | Discharge: 2020-03-02 | Disposition: A | Discharge status: Home / Self Care | Payer: Medicare Other | Source: Ambulatory Visit | Attending: Radiation Oncology | Admitting: Radiation Oncology

## 2020-03-02 DIAGNOSIS — Z510 Encounter for antineoplastic radiation therapy: Secondary | ICD-10-CM | POA: Diagnosis not present

## 2020-03-05 ENCOUNTER — Other Ambulatory Visit: Payer: Self-pay

## 2020-03-05 ENCOUNTER — Ambulatory Visit
Admission: RE | Admit: 2020-03-05 | Discharge: 2020-03-05 | Disposition: A | Discharge status: Home / Self Care | Payer: Medicare Other | Source: Ambulatory Visit | Attending: Radiation Oncology | Admitting: Radiation Oncology

## 2020-03-05 DIAGNOSIS — Z510 Encounter for antineoplastic radiation therapy: Secondary | ICD-10-CM | POA: Diagnosis not present

## 2020-03-06 ENCOUNTER — Ambulatory Visit
Admission: RE | Admit: 2020-03-06 | Discharge: 2020-03-06 | Disposition: A | Discharge status: Home / Self Care | Payer: Medicare Other | Source: Ambulatory Visit | Attending: Radiation Oncology | Admitting: Radiation Oncology

## 2020-03-06 ENCOUNTER — Other Ambulatory Visit: Payer: Self-pay

## 2020-03-06 DIAGNOSIS — Z510 Encounter for antineoplastic radiation therapy: Secondary | ICD-10-CM | POA: Diagnosis not present

## 2020-03-07 ENCOUNTER — Encounter: Payer: Self-pay | Admitting: General Practice

## 2020-03-07 ENCOUNTER — Inpatient Hospital Stay: Payer: Medicare Other | Attending: Radiation Oncology | Admitting: Nutrition

## 2020-03-07 ENCOUNTER — Ambulatory Visit
Admission: RE | Admit: 2020-03-07 | Discharge: 2020-03-07 | Disposition: A | Discharge status: Home / Self Care | Payer: Medicare Other | Source: Ambulatory Visit | Attending: Radiation Oncology | Admitting: Radiation Oncology

## 2020-03-07 DIAGNOSIS — C051 Malignant neoplasm of soft palate: Secondary | ICD-10-CM | POA: Diagnosis not present

## 2020-03-07 DIAGNOSIS — Z510 Encounter for antineoplastic radiation therapy: Secondary | ICD-10-CM | POA: Diagnosis present

## 2020-03-07 NOTE — Progress Notes (Signed)
[place] CSW Progress Notes  Call to check in w patient mid treatment.  She is "doing as well as can be expected", has 9 more treatments to go.  Feels confident she will "make it through."  Has some difficulty eating, but no insurmountable challenges.  Encouraged her to reach out for support/resources as needed.  Edwyna Shell, LCSW Clinical Social Worker Phone:  [phone] Cell:  [phone]

## 2020-03-07 NOTE — Progress Notes (Signed)
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 Weight stable and documented as 121.8 pounds on August 30. Her mouth continues to burn and is sore. She does have difficulty swallowing. Reports dry mouth and thick saliva. She is having taste alterations. Chocolates oral nutrition supplements cause reflux.  Nutrition diagnosis: Food and nutrition related knowledge deficit continues.  Intervention: Educated patient on strategies for choosing softer foods and making sure they are covered in gravies or sauces. Encouraged baking soda and salt water mouth rinses. Stressed importance of increasing oral nutrition supplements if food intake decreases. Provided additional samples of a strawberry flavored oral nutrition supplement. Questions answered.  Teach back method used.  Monitoring, evaluation, goals: Patient will work to increase calories and protein for weight stabilization.  Next visit: Wednesday, September 8.  **Disclaimer: This note was dictated with voice recognition software. Similar sounding words can inadvertently be transcribed and this note may contain transcription errors which may not have been corrected upon publication of note.**

## 2020-03-08 ENCOUNTER — Ambulatory Visit
Admission: RE | Admit: 2020-03-08 | Discharge: 2020-03-08 | Disposition: A | Discharge status: Home / Self Care | Payer: Medicare Other | Source: Ambulatory Visit | Attending: Radiation Oncology | Admitting: Radiation Oncology

## 2020-03-08 ENCOUNTER — Inpatient Hospital Stay: Payer: Medicare Other | Admitting: General Practice

## 2020-03-08 ENCOUNTER — Other Ambulatory Visit: Payer: Self-pay

## 2020-03-08 DIAGNOSIS — C051 Malignant neoplasm of soft palate: Secondary | ICD-10-CM | POA: Diagnosis not present

## 2020-03-09 ENCOUNTER — Ambulatory Visit
Admission: RE | Admit: 2020-03-09 | Discharge: 2020-03-09 | Disposition: A | Discharge status: Home / Self Care | Payer: Medicare Other | Source: Ambulatory Visit | Attending: Radiation Oncology | Admitting: Radiation Oncology

## 2020-03-09 ENCOUNTER — Other Ambulatory Visit: Payer: Self-pay

## 2020-03-09 ENCOUNTER — Other Ambulatory Visit: Payer: Self-pay | Admitting: Radiation Oncology

## 2020-03-09 DIAGNOSIS — C051 Malignant neoplasm of soft palate: Secondary | ICD-10-CM

## 2020-03-09 MED ORDER — GELCLAIR MT GEL: OROMUCOSAL | 8 refills | Status: AC

## 2020-03-09 NOTE — Progress Notes (Signed)
Oncology Nurse Navigator Documentation  I called Ms. Thedford to let her know that Dr. Isidore Moos sent a prescription for [facility] to [facility] on [facility]. I let her know that it is not covered by insurance but Dr. Isidore Moos asked the pharmacist to apply a Good Rx discount if feasible. She voiced appreciation for the prescription and phone call. She knows to call me if she has any further questions or concerns.  Harlow Asa RN, BSN, OCN Head & Neck Oncology Nurse [place] at [facility] Phone # [phone]  Fax # [phone]

## 2020-03-13 ENCOUNTER — Ambulatory Visit
Admission: RE | Admit: 2020-03-13 | Discharge: 2020-03-13 | Disposition: A | Discharge status: Home / Self Care | Payer: Medicare Other | Source: Ambulatory Visit | Attending: Radiation Oncology | Admitting: Radiation Oncology

## 2020-03-13 ENCOUNTER — Other Ambulatory Visit: Payer: Self-pay

## 2020-03-13 ENCOUNTER — Other Ambulatory Visit: Payer: Self-pay | Admitting: Radiation Oncology

## 2020-03-13 DIAGNOSIS — C051 Malignant neoplasm of soft palate: Secondary | ICD-10-CM | POA: Diagnosis not present

## 2020-03-13 MED ORDER — GABAPENTIN 300 MG PO CAPS: ORAL_CAPSULE | ORAL | 1 refills | Status: AC

## 2020-03-14 ENCOUNTER — Other Ambulatory Visit: Payer: Self-pay

## 2020-03-14 ENCOUNTER — Ambulatory Visit
Admission: RE | Admit: 2020-03-14 | Discharge: 2020-03-14 | Disposition: A | Discharge status: Home / Self Care | Payer: Medicare Other | Source: Ambulatory Visit | Attending: Radiation Oncology | Admitting: Radiation Oncology

## 2020-03-14 ENCOUNTER — Inpatient Hospital Stay: Payer: Medicare Other | Admitting: Nutrition

## 2020-03-14 DIAGNOSIS — C051 Malignant neoplasm of soft palate: Secondary | ICD-10-CM | POA: Diagnosis not present

## 2020-03-14 NOTE — Progress Notes (Signed)
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 Patient weighed 117 pounds today down from 121.8 pounds August 30. Patient continues to have very sore mouth and severe pain with swallowing. She reports taste alterations. Lidocaine has been prescribed but does not last very long.  She is only drinking about one half carton of Ensure Plus daily.  She was consuming some soft foods such as fish and macaroni and cheese. Patient does not have a feeding tube. Final radiation scheduled for September 15. Patient had questions about medication interactions.  Nutrition diagnosis: Food and nutrition related knowledge deficit continues.  Intervention: Educated patient to increase calories and protein and soft foods throughout the day. Encouraged her to take medications as prescribed including lidocaine and gabapentin.   Encouraged her to contact her pharmacist for medication questions. Provided some additional nutrition samples for patient to try to increase calories and protein. Also gave patient liquid protein to mix into the other foods. Provided support and encouragement.  Monitoring, evaluation, goals: Patient will tolerate adequate calories and protein to minimize further weight loss.  Next visit: Monday, September 13.  **Disclaimer: This note was dictated with voice recognition software. Similar sounding words can inadvertently be transcribed and this note may contain transcription errors which may not have been corrected upon publication of note.**

## 2020-03-15 ENCOUNTER — Ambulatory Visit
Admission: RE | Admit: 2020-03-15 | Discharge: 2020-03-15 | Disposition: A | Discharge status: Home / Self Care | Payer: Medicare Other | Source: Ambulatory Visit | Attending: Radiation Oncology | Admitting: Radiation Oncology

## 2020-03-15 ENCOUNTER — Other Ambulatory Visit: Payer: Self-pay

## 2020-03-15 DIAGNOSIS — C051 Malignant neoplasm of soft palate: Secondary | ICD-10-CM | POA: Diagnosis not present

## 2020-03-16 ENCOUNTER — Other Ambulatory Visit: Payer: Self-pay

## 2020-03-16 ENCOUNTER — Ambulatory Visit
Admission: RE | Admit: 2020-03-16 | Discharge: 2020-03-16 | Disposition: A | Discharge status: Home / Self Care | Payer: Medicare Other | Source: Ambulatory Visit | Attending: Radiation Oncology | Admitting: Radiation Oncology

## 2020-03-16 DIAGNOSIS — C051 Malignant neoplasm of soft palate: Secondary | ICD-10-CM | POA: Diagnosis not present

## 2020-03-19 ENCOUNTER — Ambulatory Visit
Admission: RE | Admit: 2020-03-19 | Discharge: 2020-03-19 | Disposition: A | Discharge status: Home / Self Care | Payer: Medicare Other | Source: Ambulatory Visit | Attending: Radiation Oncology | Admitting: Radiation Oncology

## 2020-03-19 ENCOUNTER — Other Ambulatory Visit: Payer: Self-pay

## 2020-03-19 ENCOUNTER — Ambulatory Visit: Payer: Medicare Other | Admitting: Nutrition

## 2020-03-19 DIAGNOSIS — C051 Malignant neoplasm of soft palate: Secondary | ICD-10-CM | POA: Diagnosis not present

## 2020-03-19 NOTE — Progress Notes (Signed)
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 Weight decreased 116.2 pounds today down from 117 pounds last week. Patient continues to have a sore mouth on one side of her mouth. She reports she tolerated mandrin oranges this morning and is planning on getting beef brisket when she leaves today. She struggles with drinking oral nutrition supplements.  Reports some do not taste good and some do not taste at all. Noted final radiation therapy is scheduled for September 15. Patient has lost 4% body weight over 2 months.  Nutrition diagnosis: Food and nutrition related knowledge deficit improved.  Intervention: Provided support and encouragement with suggestions on increasing calories and protein. Recommended she drink oral nutrition supplements, especially if she cannot taste them for added calories and protein. Explained that she could likely lose even more weight over the next 2 weeks secondary to increased needs for healing.  Monitoring, evaluation, goals: Patient will tolerate increased calories and protein to minimize further weight loss.  Next visit: Telephone call on Wednesday, September 29.  Patient is agreeable.  **Disclaimer: This note was dictated with voice recognition software. Similar sounding words can inadvertently be transcribed and this note may contain transcription errors which may not have been corrected upon publication of note.**

## 2020-03-20 ENCOUNTER — Other Ambulatory Visit: Payer: Self-pay

## 2020-03-20 ENCOUNTER — Ambulatory Visit
Admission: RE | Admit: 2020-03-20 | Discharge: 2020-03-20 | Disposition: A | Discharge status: Home / Self Care | Payer: Medicare Other | Source: Ambulatory Visit | Attending: Radiation Oncology | Admitting: Radiation Oncology

## 2020-03-20 DIAGNOSIS — C051 Malignant neoplasm of soft palate: Secondary | ICD-10-CM

## 2020-03-20 MED ORDER — LORAZEPAM 1 MG PO TABS
ORAL_TABLET | ORAL | Status: AC
  Filled 2020-03-20: qty 1

## 2020-03-20 MED ORDER — LORAZEPAM 0.5 MG PO TABS
0.5000 mg | ORAL_TABLET | Freq: Once | ORAL | Status: AC
  Administered 2020-03-20: 0.5 mg via ORAL
  Filled 2020-03-20: qty 1

## 2020-03-21 ENCOUNTER — Other Ambulatory Visit: Payer: Self-pay

## 2020-03-21 ENCOUNTER — Ambulatory Visit
Admission: RE | Admit: 2020-03-21 | Discharge: 2020-03-21 | Disposition: A | Discharge status: Home / Self Care | Payer: Medicare Other | Source: Ambulatory Visit | Attending: Radiation Oncology | Admitting: Radiation Oncology

## 2020-03-21 ENCOUNTER — Encounter: Payer: Self-pay | Admitting: Radiation Oncology

## 2020-03-21 DIAGNOSIS — C051 Malignant neoplasm of soft palate: Secondary | ICD-10-CM | POA: Diagnosis not present

## 2020-03-21 NOTE — Progress Notes (Signed)
Oncology Nurse Navigator Documentation  Met with Ms. Moretto before final RT to offer support and to celebrate end of radiation treatment.   Provided verbal/written post-RT guidance:  Importance of keeping all follow-up appts, especially those with Nutrition and SLP.  Importance of protecting treatment area from sun.  Continuation of Sonafine application 2-3 times daily, application of antibiotic ointment to areas of raw skin; when supply of Sonafine exhausted transition to OTC lotion with vitamin E. Provided/reviewed Epic calendar of upcoming appts. Explained my role as navigator will continue for several more months, encouraged him to call me with needs/concerns.    Harlow Asa RN, BSN, OCN Head & Neck Oncology Nurse [place] at [facility] Phone # [phone]  Fax # [phone]

## 2020-03-28 ENCOUNTER — Other Ambulatory Visit: Payer: Self-pay

## 2020-03-28 ENCOUNTER — Ambulatory Visit: Payer: Medicare Other | Attending: Radiation Oncology

## 2020-03-28 DIAGNOSIS — R1310 Dysphagia, unspecified: Secondary | ICD-10-CM | POA: Insufficient documentation

## 2020-03-28 NOTE — Therapy (Signed)
[facility] [address] [place], [place], [address] Phone: [phone]   Fax:  [phone]  Speech Language Pathology Treatment  Patient Details  Name: Deanna Burns MRN: [id number] Date of Birth: 20-Aug-1945 Referring Provider (SLP): Eppie Gibson, MD   Encounter Date: 03/28/2020   End of Session - 03/28/20 1554    Visit Number 2    Number of Visits 7    Date for SLP Re-Evaluation 05/23/20    SLP Start Time 7510    SLP Stop Time  1520    SLP Time Calculation (min) 33 min    Activity Tolerance Patient tolerated treatment well           Past Medical History:  Diagnosis Date  . Asthma   . COPD (chronic obstructive pulmonary disease) ([place])   . Hypertension   . Nosebleed     Past Surgical History:  Procedure Laterality Date  . ABDOMINAL HYSTERECTOMY    . BACK SURGERY      There were no vitals filed for this visit.   Subjective Assessment - 03/28/20 1455    Subjective Pt reports decr'd taste with meats and aguesia    Currently in Pain? Yes    Pain Score 2     Pain Location Mouth                 ADULT SLP TREATMENT - 03/28/20 1457      General Information   Behavior/Cognition Alert;Cooperative;Pleasant mood      Treatment Provided   Treatment provided Dysphagia      Dysphagia Treatment   Temperature Spikes Noted No    Respiratory Status Room air    Oral Cavity - Dentition Adequate natural dentition    Patient observed directly with PO's Yes    Type of PO's observed Dysphagia 1 (puree);Thin liquids   politely refused cereal bar   Other treatment/comments Currently eats canned mandarin oranges, fried eggs, soft biscuits with gravy, iceberg lettuce salad with olives and meats, sweet potato. Was doing HEP until a week ago and decr'd due to mouth pain. Independent with HEP today, ate applesauce and drank water without overt s/sx aspiration. Pt req'd min a for rationale for HEP, and told SLP how  food journal could assist pt to return to normal diet.       Assessment / Recommendations / Plan   Plan Continue with current plan of care      Progression Toward Goals   Progression toward goals Progressing toward goals            SLP Education - 03/28/20 1554    Education Details food journal benefits    Person(s) Educated Patient    Methods Explanation    Comprehension Verbalized understanding            SLP Short Term Goals - 03/28/20 1556      SLP SHORT TERM GOAL #1   Title pt will complete HEP with rare min A    Period --   sessions, for all STGs   Status Achieved      SLP SHORT TERM GOAL #2   Title pt will tell SLP why she is completing HEP    Time 1    Status On-going      SLP SHORT TERM GOAL #3   Title pt will describe 3 overt s/s aspiration PNA with modified independence    Time 2    Status On-going      SLP  SHORT TERM GOAL #4   Title pt will tell SLP how a food journal could hasten return to a more normalized diet    Status Achieved            SLP Long Term Goals - 03/28/20 1557      SLP LONG TERM GOAL #1   Title pt will complete HEP with modified independence over three visits    Baseline 03-28-20    Time 3    Period --   visits/sessions, for all LTGs   Status Revised      SLP LONG TERM GOAL #2   Title pt will describe how to modify HEP over time, and the timeline associated with reduction in HEP frequency with modified independence over two sessions    Time 5    Status On-going            Plan - 03/28/20 1555    Clinical Impression Statement Pt's swallowing remains WNL/WFL with thin liquids and dys I. SLP reviewed Mandy's individualized HEP for dysphagia and pt completed each exercise on her own with modifed independence. There are no overt s/s aspiration reported by pt at this time. Data indicate that pt's swallow ability will likely decrease over the course of radiation therapy and could very well decline over time following conclusion  of their radiation therapy due to muscle disuse atrophy and/or muscle fibrosis. Pt will cont to need to be seen by SLP in order to assess safety of PO intake, assess the need for recommending any objective swallow assessment, and ensuring pt correctly completes the individualized HEP.    Speech Therapy Frequency --   once every approx 4 weeks   Duration --   7 total sessions (renew after 90 days)   Treatment/Interventions Aspiration precaution training;Pharyngeal strengthening exercises;Diet toleration management by SLP;Trials of upgraded texture/liquids;Patient/family education;SLP instruction and feedback;Compensatory techniques    Potential to Achieve Goals Good    SLP Home Exercise Plan provided today    Consulted and Agree with Plan of Care Patient           Patient will benefit from skilled therapeutic intervention in order to improve the following deficits and impairments:   Dysphagia, unspecified type    Problem List Patient Active Problem List   Diagnosis Date Noted  . Cancer of soft palate ([place]) 01/03/2020    [facility] ,[place], [place]  03/28/2020, 4:00 PM  [facility] [place] [address] [place], [place], [address] Phone: [phone]   Fax:  [phone]   Name: Deanna Burns MRN: [id number] Date of Birth: Dec 01, 1945

## 2020-04-04 ENCOUNTER — Inpatient Hospital Stay: Payer: Medicare Other | Admitting: Nutrition

## 2020-04-04 ENCOUNTER — Telehealth: Payer: Self-pay | Admitting: Nutrition

## 2020-04-04 NOTE — Telephone Encounter (Signed)
Contacted patient by telephone for nutrition follow up. Patient status post radiation therapy for soft palate cancer. She does not have a feeding tube. She reports she is feeling well. Mouth sores have completely gone away. Weight at home is stable documented as 116 pounds today. She is eating well, however still has no taste. Final radiation therapy was September 15.  Nutrition diagnosis: Food and nutrition related knowledge deficit resolved.  Provided support and encouragement for patient to continue strategies for increased calories and protein. Will contact patient in approximately 1 month by telephone for nutrition follow-up.  **Disclaimer: This note was dictated with voice recognition software. Similar sounding words can inadvertently be transcribed and this note may contain transcription errors which may not have been corrected upon publication of note.**

## 2020-04-12 NOTE — Progress Notes (Signed)
error 

## 2020-04-13 ENCOUNTER — Ambulatory Visit
Admission: RE | Admit: 2020-04-13 | Discharge: 2020-04-13 | Disposition: A | Discharge status: Home / Self Care | Payer: Medicare Other | Source: Ambulatory Visit | Attending: Radiation Oncology | Admitting: Radiation Oncology

## 2020-04-13 ENCOUNTER — Telehealth: Payer: Self-pay | Admitting: Radiation Oncology

## 2020-04-13 NOTE — Telephone Encounter (Signed)
Deanna Burns for 2:40 today has called and cancelled due to not feeling well. She has r/s to 10/15 @ 2pm. I've let Dr. Isidore Moos and nurse Althia Forts know.

## 2020-04-13 NOTE — Progress Notes (Signed)
                                                                                                                                                            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) Staging form: Pharynx - P16 Negative Oropharynx, AJCC 8th Edition - Clinical stage from 01/11/2020: Stage II (cT2, cN0, cM0) - Signed by Eppie Gibson, MD on 01/13/2020  Intent: Curative  Radiation Treatment Dates: 02/22/2020 through 03/21/2020 Site Technique Total Dose (Gy) Dose per Fx (Gy) Completed Fx Beam Energies  Neck: HN IMRT 54/54 2.7 20/20 6X   Narrative: The patient tolerated radiation therapy relatively well.   Plan: The patient will follow-up with radiation oncology in 2-3 weeks .  -----------------------------------  Eppie Gibson, MD

## 2020-04-20 ENCOUNTER — Other Ambulatory Visit: Payer: Self-pay

## 2020-04-20 ENCOUNTER — Inpatient Hospital Stay: Payer: Medicare Other | Attending: Radiation Oncology

## 2020-04-20 ENCOUNTER — Ambulatory Visit
Admission: RE | Admit: 2020-04-20 | Discharge: 2020-04-20 | Disposition: A | Discharge status: Home / Self Care | Payer: Medicare Other | Source: Ambulatory Visit | Attending: Radiation Oncology | Admitting: Radiation Oncology

## 2020-04-20 ENCOUNTER — Telehealth: Payer: Self-pay | Admitting: *Deleted

## 2020-04-20 VITALS — BP 128/75 | HR 76 | Temp 97.7°F | Resp 17 | Wt 115.2 lb

## 2020-04-20 DIAGNOSIS — Z923 Personal history of irradiation: Secondary | ICD-10-CM | POA: Insufficient documentation

## 2020-04-20 DIAGNOSIS — Z23 Encounter for immunization: Secondary | ICD-10-CM | POA: Insufficient documentation

## 2020-04-20 DIAGNOSIS — C051 Malignant neoplasm of soft palate: Secondary | ICD-10-CM

## 2020-04-20 MED ORDER — MAGIC MOUTHWASH W/LIDOCAINE: ORAL | 5 refills | Status: DC

## 2020-04-20 NOTE — Progress Notes (Signed)
Ms. Christian presents today for follow-up of radiation to her head/neck (soft palate) completed on 03/21/2020  Pain issues, if any: Reports a sore in the left side of her tongue that won't seem to go away (10 out of 10 pain without lidocaine). She also reports pain in her left jaw that radiates up to her left ear. She denies any difficulty opening/closing her mouth (and states she's diligent about doing her exercises).  Using a feeding tube?: N/A Weight changes, if any:  Wt Readings from Last 3 Encounters:  04/20/20 115 lb 4 oz (52.3 kg)  03/19/20 116 lb 3.2 oz (52.7 kg)  03/14/20 117 lb (53.1 kg)   Swallowing issues, if any: Per patient, depends on the day and if her throat is sore. Her main issue is being unable to chew on the left side of her mouth where she had teeth extracted. She states that pain and fatigue from having to solely chew on the right side limit her oral intake  Smoking or chewing tobacco? Smoking 3 cigarettes a day Using fluoride trays daily? N/A Last ENT visit was on: Not since diagnosis  Other notable issues, if any: No swelling or tenderness to to jaw/neck. She reports her sense of taste is slowly coming back (able to taste salt easily and chocolate as well). Skin in treatment field looks intact and almost back to baseline coloring. She's having some vision issues in her right eye (similar to before her cataracts were removed), but she has 2 eye appointments next week to address.   Vitals:   04/20/20 1357  BP: 128/75  Pulse: 76  Resp: 17  Temp: 97.7 F (36.5 C)  SpO2: 94%

## 2020-04-20 NOTE — Telephone Encounter (Signed)
CALLED PATIENT TO INFORM OF 4 MONTH FU WITH DR. Isidore Moos ON 08-25-19 @ 2:40 PM, LVM FOR A RETURN CALL

## 2020-04-20 NOTE — Progress Notes (Signed)
Oncology Nurse Navigator Documentation  I met with Ms. Duprey and her significant other, Shanon Brow during her visit with Dr. Isidore Moos today. She feels well. She is able to taste most foods that she tried, but reports a persistent mouth sore. Dr. Isidore Moos will send in a prescription for [facility] mouthwash. She has been scheduled to see Dr. Isidore Moos again on 08/24/2020. I also contacted Dr. Waynard Edwards office at the request of Dr. Isidore Moos and have scheduled Ms. Evans to see him on 05/30/20 at 1:30pm. Ms. Hedeen was escorted upstairs to registration to receive her COVID booster shot in our medical oncology clinic. They know to call me if they have any further questions or concerns.   Harlow Asa RN, BSN, OCN Head & Neck Oncology Nurse [place] at [facility] Phone # [phone]  Fax # [phone]

## 2020-04-23 ENCOUNTER — Other Ambulatory Visit: Payer: Self-pay

## 2020-04-23 ENCOUNTER — Ambulatory Visit (HOSPITAL_COMMUNITY): Payer: Medicare Other | Admitting: Dentistry

## 2020-04-23 DIAGNOSIS — Z923 Personal history of irradiation: Secondary | ICD-10-CM | POA: Diagnosis not present

## 2020-04-23 DIAGNOSIS — C051 Malignant neoplasm of soft palate: Secondary | ICD-10-CM

## 2020-04-23 NOTE — Addendum Note (Signed)
Encounter addended by: Eppie Gibson, MD on: 04/23/2020 8:55 AM  Actions taken: Clinical Note Signed

## 2020-04-23 NOTE — Progress Notes (Signed)
DENTAL VISIT: FOLLOW-UP   Date of Appointment:  04/23/2020 Patient Name:   Deanna Burns Date of Birth:   [birth year] Medical Record Number: [id number]  _______________________________________  COVID 19 SCREENING: The patient does not symptoms concerning for COVID-19 infection (Including fever, chills, cough, or new SHORTNESS OF BREATH).     . 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 Patient has completed 20 of 20 radiation treatments from 02/22/20 thru 03/21/20. . Reviewed medical history with the patient.  No changes reported.  VITALS: BP 100/81 (BP Location: Right Arm)   Pulse 79   Temp 97.9 F (36.6 C)   Wt 112 lb (50.8 kg)   BMI 19.84 kg/m    REVIEW OF CHIEF COMPLAINTS: DRY MOUTH: Yes HARD TO SWALLOW: No HURT TO SWALLOW: No TASTE CHANGES: Taste is slowly returning SORES IN MOUTH: Yes, has sore on the left side of tongue TRISMUS: Patient denies WEIGHT: Down from 118 lbs to 112 lbs  HOME ORAL HYGIENE REGIMEN: BRUSHING: Twice a day FLOSSING: Occasionally RINSING: Not currently using any rinses FLUORIDE: Using every night at bedtime TRISMUS EXERCISES: Maximum interincisal opening: 65mm   Patient Active Problem List   Diagnosis Date Noted  . Cancer of soft palate ([place]) 01/03/2020   Past Medical History:  Diagnosis Date  . Asthma   . COPD (chronic obstructive pulmonary disease) ([place])   . Hypertension   . Nosebleed    Past Surgical History:  Procedure Laterality Date  . ABDOMINAL HYSTERECTOMY    . BACK SURGERY     Current Outpatient Medications  Medication Sig Dispense Refill  . albuterol (PROVENTIL HFA;VENTOLIN HFA) 108 (90 BASE) MCG/ACT inhaler Inhale 2 puffs into the lungs every 6 (six) hours as needed. For shortness of breath.    . ALPRAZolam (XANAX) 0.25 MG tablet Take 0.125 mg by mouth every morning.     Marland Kitchen aluminum hydroxide-magnesium carbonate (GAVISCON) 95-358 MG/15ML SUSP Take 15 mLs by mouth  as needed.    . bisoprolol (ZEBETA) 5 MG tablet Take 2.5 mg by mouth daily.     . cycloSPORINE (RESTASIS) 0.05 % ophthalmic emulsion Place 1 drop into both eyes 2 (two) times daily.    Marland Kitchen gabapentin (NEURONTIN) 300 MG capsule Take 1 capsule TID to reduce pain. 90 capsule 1  . HYDROcodone-acetaminophen (NORCO) 5-325 MG tablet Take 1-2 tablets by mouth every 6 (six) hours as needed. (Patient not taking: Reported on 01/03/2020) 12 tablet 0  . ibuprofen (ADVIL,MOTRIN) 200 MG tablet Take 200-400 mg by mouth every 6 (six) hours as needed. For pain.    Marland Kitchen lidocaine (XYLOCAINE) 2 % solution Patient: Mix 1part 2% viscous lidocaine, 1part H20. Swish & swallow 53mL of diluted mixture, 32min before meals and at bedtime, up to QID 200 mL 4  . LORazepam (ATIVAN) 0.5 MG tablet Take 1 tablet 30 min before PET scan or Radiotherapy PRN claustrophobia. 15 tablet 0  . magic mouthwash w/lidocaine SOLN 1 part maalox plus (cherry preferred), 1 part nystatin, 1 part benadryl, 3 parts 2% viscous lidocaine. Swish 83mL for 60 seconds, then spit. Use 4-8 times daily. 500 mL 5  . montelukast (SINGULAIR) 10 MG tablet Take 10 mg by mouth at bedtime.      . mucosal barrier oral (GELCLAIR) GEL Mix 1 packet with 27mL water and swish/ gargle for 60 sec. Spit out. Do not eat or drink for 1 hour after. Use up to TID, prn mucosal pain. 21 packet 8  .  ondansetron (ZOFRAN) 8 MG tablet Take 1 tablet (8 mg total) by mouth every 8 (eight) hours as needed for nausea or vomiting. 20 tablet 3  . polyethylene glycol (MIRALAX / GLYCOLAX) 17 g packet Take 17 g by mouth daily as needed.    . sodium fluoride (PREVIDENT 5000 PLUS) 1.1 % CREA dental cream Apply to tooth brush. Brush teeth for 2 minutes. Spit out excess-DO NOT swallow. DO NOT rinse afterwards. Repeat nightly. (Patient not taking: Reported on 01/11/2020) 51 g prn  . Umeclidinium-Vilanterol (ANORO ELLIPTA IN) Inhale into the lungs.     No current facility-administered medications for this  visit.   Allergies  Allergen Reactions  . Aspirin Swelling    Irritates stomach leads to vomiting   . Latex   . Penicillins Swelling  . Sulfa Antibiotics Swelling     DENTAL EXAM: Oral Hygiene:(PLAQUE): Generalized mild accumulation LOCATION OF MUCOSITIS: Large ulceration on the left lateral border of tongue extending from region of tooth #20 behind missing #17. DESCRIPTION OF SALIVA: Thick, viscous salivary secretions ANY EXPOSED BONE: None OTHER WATCHED AREAS: Extractions sites are healing WNL  Patient is still reporting some pain which may be as a result of radiation.   DIAGNOSES: 1. Xerostomia: Patient reports dry mouth and saliva that gets stuck in her throat.  She has not been using anything for this.  Gave her coupon for CLOSYs mouthrinse/gel to try out which is OTC. 2. Dysgeusia: She reports her taste is slowly returning. 3. Mucositis: She has been struggling with an ulceration on the lateral border of her tongue on the left side since she started radiation.  She reports that it comes and goes with relation to pain, but right now it is at its worst 10/10.  She uses lidocaine mouthrinse which relieves pain for 15 minutes at a time.  She was recently prescribed Magic Mouthwash by Dr. Isidore Moos and reports the same relief of about 15 minutes after use.  I recommend she try rinsing with the lidocaine to numb the region, and follow that with warm salt water rinses or baking soda rinses as many times a day as she can tolerate.  Explained that there is also the option of prescribing her a steroid rinse if this doesn't alleviate her pain or improve healing after a few days, however the rinse can also cause oral candidiasis so she would also have to use another rinse to prevent this.   4. Weight Loss: Down from 118 lbs to 112 lbs   RECOMMENDATIONS: 1. Brush after meals and at bedtime.  Continue to use fluoride at bedtime. 2. Use trismus exercises as directed. 3. Use CLOSYs for dry mouth and  salt water/baking soda rinses for mouth sores. 4. Take multiple sips of water as needed.  Stay hydrated. 5. Return to regular dentist for routine dental care including cleanings and periodic exams.  If patient does not have a regular dentist, establish care at an outside office of his/her choice.  6. Call if any problems or concerns arise.   . All questions/concerns were addressed, and patient verbalized understanding of discussion, findings and recommendations.   . Patient tolerated today's visit well and departed in stable condition.   [place] Benson [place], DMD

## 2020-04-23 NOTE — Patient Instructions (Signed)
[place] Department of Dental Medicine Dr. Debe Coder B. Benson Norway, DMD Phone: [phone] Fax: [phone]   It was a pleasure seeing you today!  Please refer to the information below regarding your dental visit with [place], and call us should you have any questions or concerns that may come up after you leave.   Thank you for giving [place] the opportunity to provide care for you.  If there is anything we can do for you, please let us know.    RECOMMENDATIONS: 1. Brush after meals and at bedtime.  Floss once a day, and use fluoride at bedtime. 2. Use trismus exercises as directed below. 3. Use salt water/baking soda rinses to help with healing any mouth sores. 4. Take multiple sips of water as needed.  Stay hydrated. 5. Return to your regular dentist for routine dental care including cleanings and periodic exams.  If you do not have a regular dentist, it is important to establish care at an outside dental office of your choice.  6. Call us if any problems or concerns arise.  TRISMUS Trismus is a condition where the jaw does not allow the mouth to open as wide as it usually does.  This can happen almost suddenly, or in other cases the process is so slow, it is hard to notice it-until it is too far along.  When the jaw joints and/or muscles have been exposed to radiation treatments, the onset of Trismus is very slow.  This is because the muscles are losing their stretching ability over a long period of time, as long as 2 YEARS after the end of radiation.  It is therefore important to exercise these muscles and joints.  TRISMUS EXERCISES:  [place] of tongue depressors measuring the same or a little less than the last documented MIO (Maximum Interincisal Opening).  Secure them with a rubber band on both ends.  Place the stack in the patient's mouth, supporting the other end.  Allow 30 seconds for muscle stretching.  Rest for a few seconds.  Repeat 3-5 times.  For all radiation patients, this  exercise is recommended in the mornings and evenings unless otherwise instructed.  The exercise should be done for a period of 2 YEARS after the end of radiation.  Maximum jaw opening should be checked routinely on recall dental visits by your general dentist.  The patient is advised to report any changes, soreness, or difficulties encountered when doing the exercises.

## 2020-04-23 NOTE — Progress Notes (Addendum)
Radiation Oncology         [phone] ________________________________  Name: Deanna Burns MRN: [id number]  Date: 04/20/2020  DOB: [birth year]  Follow-Up Visit Note  CC: Rusty Aus, MD  Rusty Aus, MD  Diagnosis and Prior Radiotherapy:       ICD-10-CM   1. 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 Cancer of soft palate (HCC)  C05.1 magic mouthwash w/lidocaine SOLN   Cancer Staging: Cancer Staging Cancer of soft palate ([place]) Staging form: Pharynx - P16 Negative Oropharynx, AJCC 8th Edition - Clinical stage from 01/11/2020: Stage II (cT2, cN0, cM0) - Signed by Eppie Gibson, MD on 01/13/2020  Intent: Curative  Radiation Treatment Dates: 02/22/2020 through 03/21/2020 Site Technique Total Dose (Gy) Dose per Fx (Gy) Completed Fx Beam Energies  Neck: HN IMRT 54/54 2.7 20/20 6X   CHIEF COMPLAINT:  Here for follow-up and surveillance of oral cancer  Narrative:  The patient returns today for routine follow-up.   Deanna Burns presents today for follow-up of radiation to her head/neck (soft palate) completed on 03/21/2020  Pain issues, if any: Reports a sore in the left side of her tongue that won't seem to go away (10 out of 10 pain without lidocaine). She also reports pain in her left jaw that radiates up to her left ear. She denies any difficulty opening/closing her mouth (and states she's diligent about doing her exercises).  Using a feeding tube?: N/A Weight changes, if any:  Wt Readings from Last 3 Encounters:  04/20/20 115 lb 4 oz (52.3 kg)  03/19/20 116 lb 3.2 oz (52.7 kg)  03/14/20 117 lb (53.1 kg)   Swallowing issues, if any: Per patient, depends on the day and if her throat is sore. Her main issue is being unable to chew on the left side of her mouth where she had teeth extracted. She states that pain and fatigue from having to solely chew on the right side limit her oral intake  Smoking or chewing tobacco? Smoking 3  cigarettes a day Using fluoride trays daily? N/A Last ENT visit was on: Not since diagnosis  Other notable issues, if any: No swelling or tenderness to to jaw/neck. She reports her sense of taste is slowly coming back (able to taste salt easily and chocolate as well). Skin in treatment field looks intact and almost back to baseline coloring. She's having some vision issues in her right eye (similar to before her cataracts were removed), but she has 2 eye appointments next week to address.   Vitals:   04/20/20 1357  BP: 128/75  Pulse: 76  Resp: 17  Temp: 97.7 F (36.5 C)  SpO2: 94%                      ALLERGIES:  is allergic to aspirin, latex, penicillins, and sulfa antibiotics.  Meds: Current Outpatient Medications  Medication Sig Dispense Refill  . albuterol (PROVENTIL HFA;VENTOLIN HFA) 108 (90 BASE) MCG/ACT inhaler Inhale 2 puffs into the lungs every 6 (six) hours as needed. For shortness of breath.    . ALPRAZolam (XANAX) 0.25 MG tablet Take 0.125 mg by mouth every morning.     Marland Kitchen aluminum hydroxide-magnesium carbonate (GAVISCON) 95-358 MG/15ML SUSP Take 15 mLs by mouth as needed.    . bisoprolol (ZEBETA) 5 MG tablet Take 2.5 mg by mouth daily.     . cycloSPORINE (RESTASIS) 0.05 % ophthalmic emulsion Place 1 drop into  both eyes 2 (two) times daily.    Marland Kitchen gabapentin (NEURONTIN) 300 MG capsule Take 1 capsule TID to reduce pain. 90 capsule 1  . HYDROcodone-acetaminophen (NORCO) 5-325 MG tablet Take 1-2 tablets by mouth every 6 (six) hours as needed. (Patient not taking: Reported on 01/03/2020) 12 tablet 0  . ibuprofen (ADVIL,MOTRIN) 200 MG tablet Take 200-400 mg by mouth every 6 (six) hours as needed. For pain.    Marland Kitchen lidocaine (XYLOCAINE) 2 % solution Patient: Mix 1part 2% viscous lidocaine, 1part H20. Swish & swallow 74mL of diluted mixture, 18min before meals and at bedtime, up to QID 200 mL 4  . LORazepam (ATIVAN) 0.5 MG tablet Take 1 tablet 30 min before PET scan or Radiotherapy PRN  claustrophobia. 15 tablet 0  . magic mouthwash w/lidocaine SOLN 1 part maalox plus (cherry preferred), 1 part nystatin, 1 part benadryl, 3 parts 2% viscous lidocaine. Swish 28mL for 60 seconds, then spit. Use 4-8 times daily. 500 mL 5  . montelukast (SINGULAIR) 10 MG tablet Take 10 mg by mouth at bedtime.      . mucosal barrier oral (GELCLAIR) GEL Mix 1 packet with 50mL water and swish/ gargle for 60 sec. Spit out. Do not eat or drink for 1 hour after. Use up to TID, prn mucosal pain. 21 packet 8  . ondansetron (ZOFRAN) 8 MG tablet Take 1 tablet (8 mg total) by mouth every 8 (eight) hours as needed for nausea or vomiting. 20 tablet 3  . polyethylene glycol (MIRALAX / GLYCOLAX) 17 g packet Take 17 g by mouth daily as needed.    . sodium fluoride (PREVIDENT 5000 PLUS) 1.1 % CREA dental cream Apply to tooth brush. Brush teeth for 2 minutes. Spit out excess-DO NOT swallow. DO NOT rinse afterwards. Repeat nightly. (Patient not taking: Reported on 01/11/2020) 51 g prn  . Umeclidinium-Vilanterol (ANORO ELLIPTA IN) Inhale into the lungs.     No current facility-administered medications for this encounter.    Physical Findings: The patient is in no acute distress. Patient is alert and oriented. Wt Readings from Last 3 Encounters:  04/20/20 115 lb 4 oz (52.3 kg)  03/19/20 116 lb 3.2 oz (52.7 kg)  03/14/20 117 lb (53.1 kg)    weight is 115 lb 4 oz (52.3 kg). Her oral temperature is 97.7 F (36.5 C). Her blood pressure is 128/75 and her pulse is 76. Her respiration is 17 and oxygen saturation is 94%. .  General: Alert and oriented, in no acute distress, she presents in a wheelchair HEENT: Head is normocephalic. Extraocular movements are intact. Oropharynx is notable for confluent mucositis remaining on the lateral left oral tongue.  No thrush Neck: Neck is notable for healing skin, no palpable masses in the cervical or supraclavicular regions Skin: Skin in treatment fields shows satisfactory healing    Psychiatric: Judgment and insight are intact. Affect is appropriate.   Lab Findings: Lab Results  Component Value Date   WBC 10.0 02/14/2019   HGB 16.0 (H) 02/14/2019   HCT 49.4 (H) 02/14/2019   MCV 97.2 02/14/2019   PLT 330 02/14/2019    No results found for: TSH  Radiographic Findings: No results found.  Impression/Plan:    1) Head and Neck Cancer Status: Healing from radiotherapy; I prescribed her Magic mouthwash with lidocaine, nystatin, Maalox, and Benadryl to see if this alleviates her mouth pain and heals her mucosa instead of the lidocaine mouthwash. She will swish and spit as needed.  2) Nutritional Status: Slight weight  loss, push intake as tolerated.  We discussed methods for this once again PEG tube: None  3) Risk Factors: The patient has been educated about risk factors including alcohol and tobacco abuse; they understand that avoidance of alcohol and tobacco is important to prevent recurrences as well as other cancers.  She is still smoking but she has cut back significantly.  I applauded her on cutting back and informed her again of the importance of stopping smoking.  I believe that continuing to smoke will inhibit her healing and slow down her recovery.   4) Swallowing: Functional, continue instructions from speech-language pathology  5) Dental: Encouraged to continue regular followup with dentistry.  6) Thyroid function: Screen for hypothyroidism - check TSH annually. Baseline TSH 2.77 at [facility] 11-07-19.  7)  We discussed measures to reduce the risk of infection during the COVID-19 pandemic.  She is eligible for the Covid booster.  She is interested in this.  She will be escorted upstairs to receive this at the end of her appointment today.  8) Other: Patient will follow up with ENT in November and with me in February. She was encouraged to call with any issues or questions before then.    Note sent to Dr. Vicie Mutters to discern whether he would like to get  surveillance imaging or just pursue physical exams.  She did not have any obvious visible mass on her pretreatment images.  On date of service, in total, I spent 30 minutes on this encounter. Patient was seen in person. _____________________________________   Eppie Gibson, MD

## 2020-05-01 ENCOUNTER — Other Ambulatory Visit: Payer: Self-pay

## 2020-05-01 ENCOUNTER — Ambulatory Visit: Payer: Medicare Other | Attending: Radiation Oncology

## 2020-05-01 DIAGNOSIS — R1310 Dysphagia, unspecified: Secondary | ICD-10-CM | POA: Insufficient documentation

## 2020-05-01 NOTE — Patient Instructions (Signed)
Signs of Aspiration Pneumonia   . Chest pain/tightness . Fever (can be low grade) . Cough  o With foul-smelling phlegm (sputum) o With sputum containing pus or blood o With greenish sputum . Fatigue  . Shortness of breath  . Wheezing   **IF YOU HAVE THESE SIGNS, CONTACT YOUR DOCTOR OR GO TO THE EMERGENCY DEPARTMENT OR URGENT CARE AS SOON AS POSSIBLE**      

## 2020-05-01 NOTE — Therapy (Signed)
[facility] [address] [place], [place], [address] Phone: [phone]   Fax:  [phone]  Speech Language Pathology Treatment  Patient Details  Name: Deanna Burns MRN: [id number] Date of Birth: 1946/03/26 Referring Provider (SLP): Eppie Gibson, MD   Encounter Date: 05/01/2020   End of Session - 05/01/20 2245    Visit Number 3    Number of Visits 7    Date for SLP Re-Evaluation 05/23/20    SLP Start Time 4967    SLP Stop Time  1430    SLP Time Calculation (min) 27 min    Activity Tolerance Patient tolerated treatment well           Past Medical History:  Diagnosis Date  . Asthma   . COPD (chronic obstructive pulmonary disease) ([place])   . Hypertension   . Nosebleed     Past Surgical History:  Procedure Laterality Date  . ABDOMINAL HYSTERECTOMY    . BACK SURGERY      There were no vitals filed for this visit.          ADULT SLP TREATMENT - 05/01/20 1406      General Information   Behavior/Cognition Alert;Cooperative;Pleasant mood      Treatment Provided   Treatment provided Dysphagia      Dysphagia Treatment   Temperature Spikes Noted No    Respiratory Status Room air    Oral Cavity - Dentition Adequate natural dentition    Patient observed directly with PO's Yes    Type of PO's observed Dysphagia 1 (puree);Thin liquids   again, politely refused cereal bar   Oral Phase Signs & Symptoms Other (comment)   none noted   Pharyngeal Phase Signs & Symptoms Other (comment)   none noted   Other treatment/comments Pt reports she lost one pound and has gained half a pound back. Still eating a wide variety of things. Pt was independent with HEP today, reports completing once every other day or so. SLP reiterated BID completion maybe after lidocaine solution to assist with pain management. Deanna Burns told SLP rationale for HEP with independence today. She provided SLP 3 overt s/sx aspiration PNA with  modified independence.       Assessment / Recommendations / Plan   Plan Continue with current plan of care      Progression Toward Goals   Progression toward goals Progressing toward goals            SLP Education - 05/01/20 2244    Education Details aspiration PNA s/sx    Person(s) Educated Patient;Caregiver(s)    Methods Explanation;Handout    Comprehension Verbalized understanding            SLP Short Term Goals - 05/01/20 1420      SLP SHORT TERM GOAL #1   Title pt will complete HEP with rare min A    Period --   sessions, for all STGs   Status Achieved      SLP SHORT TERM GOAL #2   Title pt will tell SLP why she is completing HEP    Status Achieved      SLP SHORT TERM GOAL #3   Title pt will describe 3 overt s/s aspiration PNA with modified independence    Status Achieved      SLP SHORT TERM GOAL #4   Title pt will tell SLP how a food journal could hasten return to a more normalized diet    Status Achieved  SLP Long Term Goals - 05/01/20 2250      SLP LONG TERM GOAL #1   Title pt will complete HEP with modified independence over three visits    Baseline 03-28-20, 05-01-20    Time 2    Period --   visits/sessions, for all LTGs   Status On-going      SLP LONG TERM GOAL #2   Title pt will describe how to modify HEP over time, and the timeline associated with reduction in HEP frequency with modified independence over two sessions    Time 4    Status On-going            Plan - 05/01/20 2249    Clinical Impression Statement Pt's swallowing remains WNL/WFL with thin liquids and dys I. SLP reviewed Deanna Burns's individualized HEP for dysphagia and pt completed each exercise on her own with modifed independence. There are no overt s/s aspiration reported by pt at this time, nor observed today. Data indicate that pt's swallow ability will likely decrease over the course of radiation therapy and could very well decline over time following conclusion of  their radiation therapy due to muscle disuse atrophy and/or muscle fibrosis. Pt will cont to need to be seen by SLP in order to assess safety of PO intake, assess the need for recommending any objective swallow assessment, and ensuring pt correctly completes the individualized HEP.    Speech Therapy Frequency --   once every approx 4 weeks   Duration --   7 total sessions (renew after 90 days)   Treatment/Interventions Aspiration precaution training;Pharyngeal strengthening exercises;Diet toleration management by SLP;Trials of upgraded texture/liquids;Patient/family education;SLP instruction and feedback;Compensatory techniques    Potential to Achieve Goals Good    SLP Home Exercise Plan provided today    Consulted and Agree with Plan of Care Patient           Patient will benefit from skilled therapeutic intervention in order to improve the following deficits and impairments:   Dysphagia, unspecified type    Problem List Patient Active Problem List   Diagnosis Date Noted  . Cancer of soft palate ([place]) 01/03/2020    [facility] ,MS, CCC-SLP  05/01/2020, 10:50 PM  [facility] [address] [place], [place], [address] Phone: [phone]   Fax:  [phone]   Name: Deanna Burns MRN: [id number] Date of Birth: 1946/05/09

## 2020-05-03 ENCOUNTER — Inpatient Hospital Stay: Payer: Medicare Other | Admitting: Nutrition

## 2020-05-03 NOTE — Progress Notes (Signed)
Nutrition follow-up completed by telephone.  Patient status post radiation therapy for soft palate cancer on September 15.  Patient does not have a feeding tube. Weight has decreased and was documented as 112 pounds October 18 down from 116 pounds September 29. Patient reports the sore on her tongue has returned. Her mouth is burning with anything she eats that has an acidic base. She is eating corn beef, cupcakes, donuts, and drinking almond milk. Reports she uses baking soda and salt water rinses regularly. She uses lidocaine and magic mouthwash. Reports taste is improving.  Nutrition diagnosis: Unintended weight loss related to soft palate cancer and associated treatments as evidenced by 4 pound weight loss over 2 weeks.  Intervention: Educated patient to work on increasing calories and protein utilizing soft high-calorie high-protein foods. Encouraged milkshakes or oral nutrition supplement for extra calories and protein. Educated to continue baking soda and salt water rinses. Teach back method used.  Monitoring, evaluation, goals: Patient will tolerate increased intake of high-calorie high-protein foods to minimize further weight loss.  Next visit: Telephone follow-up Thursday, December 9.  Patient agrees to contact me sooner as needed.  **Disclaimer: This note was dictated with voice recognition software. Similar sounding words can inadvertently be transcribed and this note may contain transcription errors which may not have been corrected upon publication of note.**

## 2020-05-22 ENCOUNTER — Encounter (HOSPITAL_COMMUNITY): Payer: Self-pay | Admitting: Dentistry

## 2020-06-13 ENCOUNTER — Ambulatory Visit: Payer: Medicare Other | Attending: Radiation Oncology

## 2020-06-13 ENCOUNTER — Other Ambulatory Visit: Payer: Self-pay

## 2020-06-13 DIAGNOSIS — R1310 Dysphagia, unspecified: Secondary | ICD-10-CM | POA: Diagnosis not present

## 2020-06-13 NOTE — Therapy (Signed)
[facility] [address] [place], [place], [address] Phone: [phone]   Fax:  [phone]  Speech Language Pathology Treatment/Discharge Summary  Patient Details  Name: Deanna Burns MRN: [id number] Date of Birth: Jun 17, 1946 Referring Provider (SLP): Eppie Gibson, MD   Encounter Date: 06/13/2020   End of Session - 06/13/20 1824    Visit Number 4    Number of Visits 7    Date for SLP Re-Evaluation 05/23/20    SLP Start Time 1404    SLP Stop Time  1438    SLP Time Calculation (min) 34 min    Activity Tolerance Patient tolerated treatment well           Past Medical History:  Diagnosis Date  . Asthma   . COPD (chronic obstructive pulmonary disease) ([place])   . Hypertension   . Nosebleed     Past Surgical History:  Procedure Laterality Date  . ABDOMINAL HYSTERECTOMY    . BACK SURGERY      There were no vitals filed for this visit.   SPEECH THERAPY DISCHARGE SUMMARY  Visits from Start of Care: 4  Current functional level related to goals / functional outcomes: See goals below. Pt met all goals.   Remaining deficits: None.   Education / Equipment: HEP procedure, late effects head/neck radiation on swallow function, how to incr difficulty of HEP, food journal.   Plan: Patient agrees to discharge.  Patient goals were met. Patient is being discharged due to meeting the stated rehab goals.  ?????        Subjective Assessment - 06/13/20 1413    Subjective Taste has returned, primarily. Pt still has some burning with more vinegar/acidic items.    Currently in Pain? No/denies                 ADULT SLP TREATMENT - 06/13/20 1416      General Information   Behavior/Cognition Alert;Cooperative;Pleasant mood      Treatment Provided   Treatment provided Dysphagia      Dysphagia Treatment   Temperature Spikes Noted No    Respiratory Status Room air    Oral Cavity - Dentition Adequate  natural dentition    Patient observed directly with PO's Yes    Type of PO's observed Regular diet;Thin liquids    Oral Phase Signs & Symptoms Other (comment)   none noted   Pharyngeal Phase Signs & Symptoms Other (comment)   none noted   Other treatment/comments "I have dry mouth." Dr. Vicie Mutters put pt on antibiotics for oral infection with pain. It has since stopped hurting. In last 72 hours pt had hot dogs, sauerkraut, muffins, bacon and egg on croissant, grapefruit, sub from [employer].  Pt was independent with HEP today. She told SLP she needs to cont HEP 6/7 days a week until March 15th and will incr reps orhold times if HEP becomes too easy - SLP explained this is like adjusting the weight on a weight machine. She agrees with d/c.       Assessment / Recommendations / Plan   Plan Discharge SLP treatment due to (comment)   exercises independently; safe with regular/thin     Progression Toward Goals   Progression toward goals Goals met, education completed, patient discharged from SLP            SLP Education - 06/13/20 1823    Education Details make exercises harder by incr'ing reps or hold times    Person(s)  Educated Patient;Caregiver(s)    Methods Explanation    Comprehension Verbalized understanding            SLP Short Term Goals - 05/01/20 1420      SLP SHORT TERM GOAL #1   Title pt will complete HEP with rare min A    Period --   sessions, for all STGs   Status Achieved      SLP SHORT TERM GOAL #2   Title pt will tell SLP why she is completing HEP    Status Achieved      SLP SHORT TERM GOAL #3   Title pt will describe 3 overt s/s aspiration PNA with modified independence    Status Achieved      SLP SHORT TERM GOAL #4   Title pt will tell SLP how a food journal could hasten return to a more normalized diet    Status Achieved            SLP Long Term Goals - 06/13/20 1430      SLP LONG TERM GOAL #1   Title pt will complete HEP with modified independence  over three visits    Baseline 03-28-20, 05-01-20    Time --    Period --   visits/sessions, for all LTGs   Status Achieved      SLP LONG TERM GOAL #2   Title pt will describe how to modify HEP over time, and the timeline associated with reduction in HEP frequency with modified independence over two sessions    Time --    Status Achieved            Plan - 06/13/20 1824    Clinical Impression Statement Pt's swallowing is WNL with thin liquids and dys regular foods (peanut butter crackers/water today). Pt was independent with her individualized HEP for dysphagia There are no overt s/s aspiration reported by pt at this time, nor observed today. Pt has met goals and agrees to d/c today.    Treatment/Interventions Aspiration precaution training;Pharyngeal strengthening exercises;Diet toleration management by SLP;Trials of upgraded texture/liquids;Patient/family education;SLP instruction and feedback;Compensatory techniques    Potential to Achieve Goals Good    SLP Home Exercise Plan provided today    Consulted and Agree with Plan of Care Patient           Patient will benefit from skilled therapeutic intervention in order to improve the following deficits and impairments:   Dysphagia, unspecified type    Problem List Patient Active Problem List   Diagnosis Date Noted  . Cancer of soft palate ([place]) 01/03/2020    [facility] ,MS, CCC-SLP  06/13/2020, 6:26 PM  [facility] [address] [place], [place], [address] Phone: [phone]   Fax:  [phone]   Name: Deanna Burns MRN: [id number] Date of Birth: 01/09/1946

## 2020-06-14 ENCOUNTER — Ambulatory Visit: Payer: Medicare Other | Admitting: Nutrition

## 2020-06-14 NOTE — Progress Notes (Signed)
Telephone follow-up completed with patient status post radiation therapy for soft palate cancer on September 15. Patient does not have a feeding tube. Weight is stable and was reported as 111.8 pounds by patient.  This is stable from 112 pounds October 18. Patient reports the sore on her tongue has almost healed. Mouth continues to burn with acidic foods or spicy foods. Reports eating a regular diet. Taste alterations have resolved.  Nutrition diagnosis of unintended weight loss resolved.  Provided support and encouragement for patient to continue strategies for adequate calorie and protein intake to minimize weight loss. Patient has contact information for questions or concerns. No follow-up is scheduled.  **Disclaimer: This note was dictated with voice recognition software. Similar sounding words can inadvertently be transcribed and this note may contain transcription errors which may not have been corrected upon publication of note.**

## 2020-06-22 ENCOUNTER — Other Ambulatory Visit: Payer: Self-pay

## 2020-06-22 DIAGNOSIS — C051 Malignant neoplasm of soft palate: Secondary | ICD-10-CM

## 2020-07-17 ENCOUNTER — Encounter: Payer: Self-pay | Admitting: Physical Therapy

## 2020-07-17 ENCOUNTER — Ambulatory Visit: Payer: Medicare Other | Attending: Radiation Oncology | Admitting: Physical Therapy

## 2020-07-17 ENCOUNTER — Other Ambulatory Visit: Payer: Self-pay

## 2020-07-17 DIAGNOSIS — R293 Abnormal posture: Secondary | ICD-10-CM | POA: Insufficient documentation

## 2020-07-17 DIAGNOSIS — I890 Lymphedema, not elsewhere classified: Secondary | ICD-10-CM | POA: Diagnosis present

## 2020-07-17 DIAGNOSIS — C051 Malignant neoplasm of soft palate: Secondary | ICD-10-CM | POA: Insufficient documentation

## 2020-07-17 NOTE — Therapy (Signed)
[place], [place], [address] Phone: [phone]   Fax:  [phone]  Physical Therapy Evaluation  Patient Details  Name: Deanna Burns MRN: [id number] Date of Birth: [birth year] Referring Provider (PT): Reita May Date: 07/17/2020   PT End of Session - 07/17/20 7062    Visit Number 1    Number of Visits 9    Date for PT Re-Evaluation 08/14/20    PT Start Time 1302    PT Stop Time 1350    PT Time Calculation (min) 48 min    Activity Tolerance Patient tolerated treatment well    Behavior During Therapy [facility] for tasks assessed/performed           Past Medical History:  Diagnosis Date  . Asthma   . COPD (chronic obstructive pulmonary disease) ([place])   . Hypertension   . Nosebleed     Past Surgical History:  Procedure Laterality Date  . ABDOMINAL HYSTERECTOMY    . BACK SURGERY      There were no vitals filed for this visit.    Subjective Assessment - 07/17/20 1304    Subjective I think the swelling started in November. I finished radiation on Sept 15. The swelling comes and goes. It never goes away but sometimes it is worse. It feels kind of hard.    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 No/denies   pt reports she has tenderness when looking down or touching her neck   Pain Score 0-No pain              [facility] PT Assessment - 07/17/20 0001      Assessment   Medical Diagnosis squamous cell carcinoma of soft palate    Referring Provider (PT) Squire    Onset Date/Surgical Date 09/05/19    Hand Dominance Right    Prior Therapy none      Precautions   Precautions Other (comment)    Precaution Comments lymphedema       Restrictions   Weight Bearing Restrictions No      Balance Screen   Has the patient fallen in the past 6 months No    Has the patient had a decrease in activity level because of a fear of falling?  No    Is the patient reluctant to leave their home because of a fear of falling?  No      Home Ecologist residence    Living Arrangements Spouse/significant other    Available Help at Discharge Family    Type of [place] to enter    Entrance Stairs-Number of Steps 2    Entrance Stairs-Rails Can reach both    [place] One level      Prior Function   Level of Independence Needs assistance with ADLs   needs assist with washing hair- COPD/back surgery   Vocation Retired    Leisure does not exercise      Cognition   Overall Cognitive Status Within Functional Limits for tasks assessed      Observation/Other Assessments   Observations fullness at front and sides of neck with fibrosis present  Functional Tests   Functional tests Sit to Stand      Posture/Postural Control   Posture/Postural Control Postural limitations    Postural Limitations Rounded Shoulders;Forward head      AROM   Overall AROM  Deficits    Overall AROM Comments decreased bilateral shoulder flexion (50% limited) and abduction (25% limited) due to hx of bilateral rotator cuff tears in 2016    Cervical Flexion WFL    Cervical Extension 25% limited    Cervical - Right Side Bend WFL    Cervical - Left Side Bend WFL    Cervical - Right Rotation WFL    Cervical - Left Rotation [facility]      Ambulation/Gait   Ambulation/Gait --             LYMPHEDEMA/ONCOLOGY QUESTIONNAIRE - 07/17/20 0001      Head and Neck   4 cm superior to sternal notch around neck 34.5 cm (P)     6 cm superior to sternal notch around neck 35.5 cm (P)     8 cm superior to sternal notch around neck 37.7 cm (P)                    Objective measurements completed on  examination: See above findings.       [facility] Adult PT Treatment/Exercise - 07/17/20 0001      Manual Therapy   Manual Therapy Manual Lymphatic Drainage (MLD);Edema management    Edema Management created foam chip pack in thin stockinette for pt to wear around head to help manage anterior neck swelling    Manual Lymphatic Drainage (MLD) short neck, 5 diaphragmatic breaths, bilateral axillary nodes, bilateral pectoral nodes, bilateral sub clavicular nodes, short neck, posterior, lateral and anterior neck moving fluid towards pathways then retracing all steps while verbally instructing pt and spouse in correct technique                  PT Education - 07/17/20 1353    Education Details anatomy and physiology of lymphatic system, how to treat neck lymphedema, how to use chip pack, option of compression pump    Methods Explanation    Comprehension Verbalized understanding               PT Long Term Goals - 07/17/20 1357      PT LONG TERM GOAL #1   Title Pt and/or spouse will be independent in self MLD for long term management of lymphedema.    Time 4    Period Weeks    Status New    Target Date 08/14/20      PT LONG TERM GOAL #2   Title Pt will report a 50% decrease in swelling in anterior neck to decrease risk of infection.    Time 4    Period Weeks    Status New    Target Date 08/14/20      PT LONG TERM GOAL #3   Title Pt will obtain appropriate compression garments for long term management of lymphedema.    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 Pt reports she completed radiation in Sept 2021 and began swelling  in Nov 2021. Her swelling is fibrotic to the touch and extends from anterior neck to bilateral lateral neck. Pt would benefit from skilled PT  services to decrease lymphedema, instruct pt in self MLD and assist pt in obtaining appropriate compression garments for long term management of lymphedema.    Personal Factors and Comorbidities Comorbidity 3+    Comorbidities hx of back sugery in 2014, hx of bilateral rotator cuff tears 2016, COPD    Stability/Clinical Decision Making Stable/Uncomplicated    Clinical Decision Making Low    PT Frequency 2x / week    PT Duration 4 weeks    PT Treatment/Interventions ADLs/Self Care Home Management;Patient/family education;Therapeutic exercise;Manual techniques;Manual lymph drainage;Compression bandaging;Taping;Vasopneumatic Device    PT Next Visit Plan issue [facility] handout for self MLD, see how pt feels about pump and see if Vicente Males responded about coverage, how was chip pack    PT Home Exercise Plan wear chip pack    Consulted and Agree with Plan of Care Patient;Family member/caregiver           Patient will benefit from skilled therapeutic intervention in order to improve the following deficits and impairments:  Pain,Decreased knowledge of precautions,Increased edema  Visit Diagnosis: Lymphedema, not elsewhere classified  Malignant neoplasm of soft palate [facility])     Problem List Patient Active Problem List   Diagnosis Date Noted  . Cancer of soft palate ([place]) 01/03/2020    Allyson Sabal [facility] 07/17/2020, 2:02 PM  [place], [place], [address] Phone: [phone]   Fax:  [phone]  Name: Deanna Burns MRN: [id number] Date of Birth: [birth year]  Manus Gunning, PT 07/17/20 2:02 PM

## 2020-07-24 ENCOUNTER — Encounter: Payer: Medicare Other | Admitting: Physical Therapy

## 2020-07-25 ENCOUNTER — Other Ambulatory Visit: Payer: Self-pay | Admitting: Radiation Oncology

## 2020-07-25 DIAGNOSIS — C051 Malignant neoplasm of soft palate: Secondary | ICD-10-CM

## 2020-07-25 MED ORDER — LIDOCAINE VISCOUS HCL 2 % MT SOLN: OROMUCOSAL | 2 refills | Status: AC

## 2020-07-26 ENCOUNTER — Encounter: Payer: Self-pay | Admitting: Rehabilitation

## 2020-07-26 ENCOUNTER — Ambulatory Visit: Payer: Medicare Other | Admitting: Rehabilitation

## 2020-07-26 ENCOUNTER — Other Ambulatory Visit: Payer: Self-pay

## 2020-07-26 DIAGNOSIS — R293 Abnormal posture: Secondary | ICD-10-CM

## 2020-07-26 DIAGNOSIS — C051 Malignant neoplasm of soft palate: Secondary | ICD-10-CM

## 2020-07-26 DIAGNOSIS — I890 Lymphedema, not elsewhere classified: Secondary | ICD-10-CM

## 2020-07-26 NOTE — Patient Instructions (Signed)
Access Code: [email or web address]: [email or web address]: 01/20/2022Prepared by: Ola Spurr Education  MLD H&N Anterior Route

## 2020-07-26 NOTE — Therapy (Signed)
[place], [place], [address] Phone: [phone]   Fax:  [phone]  Physical Therapy Treatment  Patient Details  Name: Deanna Burns MRN: [id number] Date of Birth: [birth year] Referring Provider (PT): Reita May Date: 07/26/2020   PT End of Session - 07/26/20 1545    Visit Number 2    Number of Visits 9    Date for PT Re-Evaluation 08/14/20    PT Start Time 4270    PT Stop Time 1540    PT Time Calculation (min) 41 min    Activity Tolerance Patient tolerated treatment well    Behavior During Therapy [facility] for tasks assessed/performed           Past Medical History:  Diagnosis Date  . Asthma   . COPD (chronic obstructive pulmonary disease) ([place])   . Hypertension   . Nosebleed     Past Surgical History:  Procedure Laterality Date  . ABDOMINAL HYSTERECTOMY    . BACK SURGERY      There were no vitals filed for this visit.   Subjective Assessment - 07/26/20 1453    Subjective Nothing new.  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 Yes    Pain Score 7     Pain Location Back    Pain Orientation Lower    Pain Descriptors / Indicators Aching    Pain Type Chronic pain    Pain Onset More than a month ago    Pain Frequency Constant                             [facility] Adult PT Treatment/Exercise - 07/26/20 0001      Manual Therapy   Edema Management pt will order marena garment size M on [employer]    Manual Lymphatic Drainage (MLD) focused initially on self MLD education using [facility] anterior handout omitting step 7 due to chest size being smaller.  Focused on  diaphragmatic breathing education with pt being a very accessory muscle breather but having a hard time most likely due to COPD.  Encouraged to practice at home.  Performed all steps seated in front of the mirror with vcs, tcs, and changes to wording as needed.  Crossed out use of chip pack prior to MLD due to no pitting.  Also gave pt shortened version of collarbones, axillae, and then anterior neck as pt is overwhelmed by all she has to do at home.  Then PT performed anterior neck clearance in seated in WC in front of mirror                  PT Education - 07/26/20 1545    Education Details anterior MLD self    Person(s) Educated Patient;Spouse    Methods Explanation;Demonstration;Tactile cues;Verbal cues;Handout    Comprehension Verbalized understanding;Returned demonstration;Verbal cues required;Tactile cues required;Need further instruction               PT Long Term Goals - 07/26/20 1549      PT LONG TERM GOAL #1   Title Pt and/or spouse will be independent in self MLD for  long term management of lymphedema.    Status Partially Met      PT LONG TERM GOAL #2   Title Pt will report a 50% decrease in swelling in anterior neck to decrease risk of infection.    Status On-going      PT LONG TERM GOAL #3   Title Pt will obtain appropriate compression garments for long term management of lymphedema.    Status Partially Met                 Plan - 07/26/20 1546    Clinical Impression Statement Pt was instructed on self MLD today and given instruction on ordering [place].  Pt overwhelmed with amount of self care she needs to do at home including speech, neck exercises, swallowing, etc., so also showed her how to perform just a shortened version better than nothing.  Pt with soft anterior neck edema with no pitting noted.    PT Frequency 2x / week    PT Duration 4 weeks    PT Treatment/Interventions ADLs/Self Care Home Management;Patient/family  education;Therapeutic exercise;Manual techniques;Manual lymph drainage;Compression bandaging;Taping;Vasopneumatic Device    PT Next Visit Plan how was self MLD? get garment? add foam to garment.   Pt may want to decrease frequency if doing well with self MLD and garment fit    Consulted and Agree with Plan of Care Patient;Family member/caregiver           Patient will benefit from skilled therapeutic intervention in order to improve the following deficits and impairments:     Visit Diagnosis: Lymphedema, not elsewhere classified  Malignant neoplasm of soft palate ([facility])  Abnormal posture     Problem List Patient Active Problem List   Diagnosis Date Noted  . Cancer of soft palate ([place]) 01/03/2020    Stark Bray 07/26/2020, 3:49 PM  [place], [place], [address] Phone: [phone]   Fax:  [phone]  Name: Deanna Burns MRN: [id number] Date of Birth: 04/08/1946

## 2020-07-31 ENCOUNTER — Ambulatory Visit: Payer: Medicare Other | Admitting: Physical Therapy

## 2020-07-31 ENCOUNTER — Other Ambulatory Visit: Payer: Self-pay

## 2020-07-31 ENCOUNTER — Encounter: Payer: Self-pay | Admitting: Physical Therapy

## 2020-07-31 DIAGNOSIS — I890 Lymphedema, not elsewhere classified: Secondary | ICD-10-CM | POA: Diagnosis not present

## 2020-07-31 NOTE — Therapy (Signed)
[place], [place], [address] Phone: [phone]   Fax:  [phone]  Physical Therapy Treatment  Patient Details  Name: Deanna Burns MRN: [id number] Date of Birth: April 30, 1946 Referring Provider (PT): Reita May Date: 07/31/2020   PT End of Session - 07/31/20 7654    Visit Number 3    Number of Visits 9    Date for PT Re-Evaluation 08/14/20    PT Start Time 1303    PT Stop Time 1357    PT Time Calculation (min) 54 min    Activity Tolerance Patient tolerated treatment well    Behavior During Therapy [facility] for tasks assessed/performed           Past Medical History:  Diagnosis Date  . Asthma   . COPD (chronic obstructive pulmonary disease) ([place])   . Hypertension   . Nosebleed     Past Surgical History:  Procedure Laterality Date  . ABDOMINAL HYSTERECTOMY    . BACK SURGERY      There were no vitals filed for this visit.   Subjective Assessment - 07/31/20 1305    Subjective I have been trying the shortened version of the massage once a day. I want to go over some things.    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 Yes    Pain Score 7     Pain Location Back    Pain Orientation Lower    Pain Descriptors / Indicators Aching    Pain Type Chronic pain    Pain Onset More than a month ago    Pain Frequency Constant                             [facility] Adult PT Treatment/Exercise - 07/31/20 0001      Manual Therapy   Edema Management pt awaiting arrival of head and neck garment    Manual Lymphatic Drainage (MLD) seated in w/c: short neck, 5 diaphragmatic breaths, bilateral axillary nodes, bilateral pectoral  nodes, bilateral sub clavicular nodes, short neck, posterior, lateral and anterior neck moving fluid towards pathways then retracing all steps while verbally instructing pt and spouse in correct technique                  PT Education - 07/31/20 1359    Education Details answered pt and spouse's questions regarding technique for self MLD, had pt and spouse return demonstrate correct technique    Person(s) Educated Spouse;Patient    Methods Explanation;Demonstration;Tactile cues    Comprehension Verbalized understanding;Returned demonstration               PT Long Term Goals - 07/26/20 1549      PT LONG TERM GOAL #1   Title Pt and/or spouse will be independent in self MLD for long term management of lymphedema.    Status Partially Met      PT LONG TERM GOAL #2   Title Pt will report a 50% decrease in swelling in anterior neck to decrease risk of infection.    Status On-going      PT LONG TERM GOAL #3   Title Pt will obtain appropriate compression garments  for long term management of lymphedema.    Status Partially Met                 Plan - 07/31/20 1400    Clinical Impression Statement Continued instructing pt and spouse on correct technique for self MLD technique. Pt is awaiting arrival of her head and neck garment. Will assess this at next session and add foam if needed. Continued MLD to anterior neck. Her neck felt softer by end of session. Will continue to progress pt towards independence with management of lymphedema.    PT Frequency 2x / week    PT Duration 4 weeks    PT Treatment/Interventions ADLs/Self Care Home Management;Patient/family education;Therapeutic exercise;Manual techniques;Manual lymph drainage;Compression bandaging;Taping;Vasopneumatic Device    PT Next Visit Plan how was self MLD? get garment? add foam to garment.   Pt may want to decrease frequency if doing well with self MLD and garment fit    Consulted and Agree with Plan of Care  Patient;Family member/caregiver           Patient will benefit from skilled therapeutic intervention in order to improve the following deficits and impairments:  Pain,Decreased knowledge of precautions,Increased edema  Visit Diagnosis: Lymphedema, not elsewhere classified     Problem List Patient Active Problem List   Diagnosis Date Noted  . Cancer of soft palate ([place]) 01/03/2020    Allyson Sabal [facility] 07/31/2020, 2:03 PM  [place], [place], [address] Phone: [phone]   Fax:  [phone]  Name: Deanna Burns MRN: [id number] Date of Birth: 1945-11-13  Manus Gunning, PT 07/31/20 2:03 PM

## 2020-08-02 ENCOUNTER — Ambulatory Visit: Payer: Medicare Other | Admitting: Physical Therapy

## 2020-08-07 ENCOUNTER — Ambulatory Visit: Payer: Medicare Other | Attending: Radiation Oncology | Admitting: Rehabilitation

## 2020-08-07 ENCOUNTER — Other Ambulatory Visit: Payer: Self-pay

## 2020-08-07 ENCOUNTER — Encounter: Payer: Self-pay | Admitting: Rehabilitation

## 2020-08-07 DIAGNOSIS — R293 Abnormal posture: Secondary | ICD-10-CM | POA: Insufficient documentation

## 2020-08-07 DIAGNOSIS — C051 Malignant neoplasm of soft palate: Secondary | ICD-10-CM | POA: Insufficient documentation

## 2020-08-07 DIAGNOSIS — I890 Lymphedema, not elsewhere classified: Secondary | ICD-10-CM | POA: Insufficient documentation

## 2020-08-07 HISTORY — PX: MOUTH SURGERY: SHX715

## 2020-08-07 NOTE — Therapy (Signed)
[place], [place], [address] Phone: [phone]   Fax:  [phone]  Physical Therapy Treatment  Patient Details  Name: KALIA VAHEY MRN: [id number] Date of Birth: 08-Dec-1945 Referring Provider (PT): Reita May Date: 08/07/2020   PT End of Session - 08/07/20 1346    Visit Number 4    Number of Visits 9    Date for PT Re-Evaluation 08/14/20    PT Start Time 1300    PT Stop Time 1345    PT Time Calculation (min) 45 min    Activity Tolerance Patient tolerated treatment well    Behavior During Therapy [facility] for tasks assessed/performed           Past Medical History:  Diagnosis Date  . Asthma   . COPD (chronic obstructive pulmonary disease) ([place])   . Hypertension   . Nosebleed     Past Surgical History:  Procedure Laterality Date  . ABDOMINAL HYSTERECTOMY    . BACK SURGERY      There were no vitals filed for this visit.   Subjective Assessment - 08/07/20 1301    Subjective I tried my new garment but i don't like how it feels on my head, like my eyes are blurry and I can hear my blood in my ears.    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 Yes    Pain Score 5     Pain Location Back    Pain Orientation Lower    Pain Descriptors / Indicators Aching    Pain Type Chronic pain                 LYMPHEDEMA/ONCOLOGY QUESTIONNAIRE - 08/07/20 0001      Head and Neck   4 cm superior to sternal notch around neck 34.5 cm    6 cm superior to sternal notch around neck 36 cm    8 cm superior to sternal notch around neck 38 cm                      [facility] Adult PT Treatment/Exercise - 08/07/20 0001      Manual  Therapy   Edema Management head and neck garment seems to fit well but pt feels a bit more anxious / claustraphobic in it as well as reporting it makes her feel like her BP goes up.  Pt instructed that she does not have to use the garment and if se would rather use the chip pack that is fine.  Pt will try it a few more times before next visit knowing to remove it if she has weird symptoms    Manual Lymphatic Drainage (MLD) seated in w/c: short neck, 5 diaphragmatic breaths, bilateral axillary nodes, bilateral pectoral nodes, bilateral sub clavicular nodes, short neck, posterior, lateral and anterior neck moving fluid towards pathways then retracing all steps while verbally instructing pt and spouse in correct technique                       PT Long Term Goals - 07/26/20 1549      PT LONG TERM GOAL #1   Title Pt and/or spouse will be independent in self  MLD for long term management of lymphedema.    Status Partially Met      PT LONG TERM GOAL #2   Title Pt will report a 50% decrease in swelling in anterior neck to decrease risk of infection.    Status On-going      PT LONG TERM GOAL #3   Title Pt will obtain appropriate compression garments for long term management of lymphedema.    Status Partially Met                 Plan - 08/07/20 1347    Clinical Impression Statement Pt has now received her garment but is unsure if she likes how it feels.  it fits well overall and does not seem too tight but pt was educated on removing the garment if she does get weird symptoms.  Pt with 1 more visit for DC.  Measurements taken today which are unchanged overall with pt mostly Ind with self MLD and use of compression.    PT Frequency 2x / week    PT Duration 4 weeks    PT Treatment/Interventions ADLs/Self Care Home Management;Patient/family education;Therapeutic exercise;Manual techniques;Manual lymph drainage;Compression bandaging;Taping;Vasopneumatic Device    PT Next Visit Plan DC  visit    Consulted and Agree with Plan of Care Patient;Family member/caregiver           Patient will benefit from skilled therapeutic intervention in order to improve the following deficits and impairments:     Visit Diagnosis: Lymphedema, not elsewhere classified  Malignant neoplasm of soft palate ([facility])  Abnormal posture     Problem List Patient Active Problem List   Diagnosis Date Noted  . Cancer of soft palate ([place]) 01/03/2020    Stark Bray 08/07/2020, 1:49 PM  [place], [place], [address] Phone: [phone]   Fax:  [phone]  Name: JAMARRIA REAL MRN: [id number] Date of Birth: 16-Jan-1946

## 2020-08-09 ENCOUNTER — Encounter: Payer: Self-pay | Admitting: Physical Therapy

## 2020-08-09 ENCOUNTER — Ambulatory Visit: Payer: Medicare Other | Admitting: Physical Therapy

## 2020-08-09 ENCOUNTER — Other Ambulatory Visit: Payer: Self-pay

## 2020-08-09 DIAGNOSIS — I890 Lymphedema, not elsewhere classified: Secondary | ICD-10-CM

## 2020-08-09 NOTE — Therapy (Signed)
[place], [place], [address] Phone: [phone]   Fax:  [phone]  Physical Therapy Treatment  Patient Details  Name: BERNESTINE HOLSAPPLE MRN: [id number] Date of Birth: [birth year] Referring Provider (PT): Reita May Date: 08/09/2020   PT End of Session - 08/09/20 1357    Visit Number 5    Number of Visits 9    Date for PT Re-Evaluation 08/14/20    PT Start Time 1749    PT Stop Time 1355    PT Time Calculation (min) 50 min    Activity Tolerance Patient tolerated treatment well    Behavior During Therapy [facility] for tasks assessed/performed           Past Medical History:  Diagnosis Date  . Asthma   . COPD (chronic obstructive pulmonary disease) ([place])   . Hypertension   . Nosebleed     Past Surgical History:  Procedure Laterality Date  . ABDOMINAL HYSTERECTOMY    . BACK SURGERY      There were no vitals filed for this visit.   Subjective Assessment - 08/09/20 1305    Subjective I have tried the garment and I have worn it between 1.5 hrs and 3 hrs once a day. The small adjustment made last time helped.    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 Yes    Pain Score 5     Pain Location Back    Pain Orientation Lower    Pain Descriptors / Indicators Aching    Pain Type Chronic pain    Pain Onset More than a month ago                             [facility] Adult PT Treatment/Exercise - 08/09/20 0001      Manual Therapy   Edema Management attempted 1/2 grey foam in thick stockinette added to [facility] but pt felt claustrophobic with this so did 1/4 inch grey foam and pt stated this was better. Applied tape over hook  over velcro to keep it from snagging her hair and make donning easier.    Manual Lymphatic Drainage (MLD) seated in w/c: short neck, 5 diaphragmatic breaths, bilateral axillary nodes, bilateral pectoral nodes, bilateral sub clavicular nodes, short neck, posterior, lateral and anterior neck moving fluid towards pathways then retracing all steps while verbally instructing pt and spouse in correct technique and providing tactile cues for pt to get correct skin stretch                       PT Long Term Goals - 08/09/20 1308      PT LONG TERM GOAL #1   Title Pt and/or spouse will be independent in self MLD for long term management of lymphedema.    Time 4    Period Weeks    Status Achieved      PT LONG TERM GOAL #2   Title Pt will report a 50% decrease in swelling in anterior neck to decrease risk of infection.    Baseline 08/09/20- 20%    Time 4    Period Weeks    Status Partially Met  PT LONG TERM GOAL #3   Title Pt will obtain appropriate compression garments for long term management of lymphedema.    Baseline 08/09/20- pt received [place] and has been able to wear it after some adjustments    Time 4    Period Weeks    Status Achieved                 Plan - 08/09/20 1358    Clinical Impression Statement Pt is now able to wear the [place] head and neck garment folloiwng the adjustments made last session. Added tape over hook part of velcro to keep it from catching her hair. Pt feels confident with self MLD though still did require some verbal and tactile cues for correct skin stretch. Added 1/4 in grey foam to [facility] to provide additional compression to front of neck. Pt has met goals for therapy and feels ready for discharge from skilled PT services.    PT Frequency 2x / week    PT Duration 4 weeks    PT Treatment/Interventions ADLs/Self Care Home Management;Patient/family education;Therapeutic exercise;Manual techniques;Manual lymph drainage;Compression  bandaging;Taping;Vasopneumatic Device    PT Next Visit Plan DC this visit    Consulted and Agree with Plan of Care Patient;Family member/caregiver           Patient will benefit from skilled therapeutic intervention in order to improve the following deficits and impairments:  Pain,Decreased knowledge of precautions,Increased edema  Visit Diagnosis: Lymphedema, not elsewhere classified     Problem List Patient Active Problem List   Diagnosis Date Noted  . Cancer of soft palate [facility]) 01/03/2020    Allyson Sabal [facility] 08/09/2020, 2:01 PM  [place], [place], [address] Phone: [phone]   Fax:  [phone]  Name: VIRIDIANA SPAID MRN: [id number] Date of Birth: 1945-08-10  PHYSICAL THERAPY DISCHARGE SUMMARY  Visits from Start of Care: 5  Current functional level related to goals / functional outcomes: All goals met   Remaining deficits: None   Education / Equipment: Self MLD, compression garment  Plan: Patient agrees to discharge.  Patient goals were met. Patient is being discharged due to meeting the stated rehab goals.  ?????     Allyson Sabal [place], [place] 08/09/20 2:01 PM

## 2020-08-20 ENCOUNTER — Other Ambulatory Visit: Payer: Self-pay

## 2020-08-20 DIAGNOSIS — Z1329 Encounter for screening for other suspected endocrine disorder: Secondary | ICD-10-CM

## 2020-08-23 ENCOUNTER — Telehealth: Payer: Self-pay | Admitting: *Deleted

## 2020-08-23 NOTE — Telephone Encounter (Signed)
Called patient to remind of lab and fu for 08-24-20, lvm for a return call

## 2020-08-24 ENCOUNTER — Ambulatory Visit
Admission: RE | Admit: 2020-08-24 | Discharge: 2020-08-24 | Disposition: A | Discharge status: Home / Self Care | Payer: Medicare Other | Source: Ambulatory Visit | Attending: Radiation Oncology | Admitting: Radiation Oncology

## 2020-08-24 ENCOUNTER — Encounter: Payer: Self-pay | Admitting: Radiation Oncology

## 2020-08-24 ENCOUNTER — Other Ambulatory Visit: Payer: Self-pay

## 2020-08-24 ENCOUNTER — Ambulatory Visit: Payer: Medicare Other

## 2020-08-24 VITALS — BP 147/74 | HR 79 | Temp 97.1°F | Resp 18 | Ht 62.0 in | Wt 113.1 lb

## 2020-08-24 DIAGNOSIS — I890 Lymphedema, not elsewhere classified: Secondary | ICD-10-CM | POA: Insufficient documentation

## 2020-08-24 DIAGNOSIS — Z923 Personal history of irradiation: Secondary | ICD-10-CM | POA: Insufficient documentation

## 2020-08-24 DIAGNOSIS — Z85819 Personal history of malignant neoplasm of unspecified site of lip, oral cavity, and pharynx: Secondary | ICD-10-CM | POA: Diagnosis not present

## 2020-08-24 DIAGNOSIS — C051 Malignant neoplasm of soft palate: Secondary | ICD-10-CM

## 2020-08-24 DIAGNOSIS — Z79899 Other long term (current) drug therapy: Secondary | ICD-10-CM | POA: Insufficient documentation

## 2020-08-24 MED ORDER — MAGIC MOUTHWASH W/LIDOCAINE: ORAL | 5 refills | Status: AC

## 2020-08-24 NOTE — Progress Notes (Signed)
Deanna Burns presents today for follow-up of radiation to her head/neck (soft palate) completed on 03/21/2020  Pain issues, if any: Patient states she just had oral surgery to file down bone. Using a feeding tube?: N/A Weight changes, if any:  Wt Readings from Last 3 Encounters:  08/24/20 113 lb 2 oz (51.3 kg)  04/23/20 112 lb (50.8 kg)  04/20/20 115 lb 4 oz (52.3 kg)   Swallowing issues, if any: none Smoking or chewing tobacco? 1-2 cigarettes per day Using fluoride trays daily? N/A Last ENT visit was on: 06/06/2020 Saw Dr. Fenton Malling:  "--Impression: NED, normal post RT sequelae --Plan: She is to see Dr Isidore Moos in Feb. I will see in May. She will discuss lymphedema therapy with Dr Isidore Moos when she sees her next.  --I do not think she needs imaging, just physical exam. I can examine with [place] if concerns arise."  Other notable issues, if any: lymphedema in neck and patient has completed physical therapy. Patient states that she has been given instruction on how to manage lymphedema.   Vitals:   08/24/20 1455  BP: (!) 147/74  Pulse: 79  Resp: 18  Temp: (!) 97.1 F (36.2 C)  SpO2: 90%

## 2020-08-30 ENCOUNTER — Encounter: Payer: Self-pay | Admitting: Radiation Oncology

## 2020-08-30 NOTE — Progress Notes (Signed)
Radiation Oncology         (336) [phone] ________________________________  Name: Deanna Burns MRN: [id number]  Date: 08/24/2020  DOB: 05-Nov-1945  Follow-Up Visit Note  CC: Rusty Aus, MD  Rusty Aus, MD  Diagnosis and Prior Radiotherapy:       ICD-10-CM   1. Cancer of soft palate (HCC)  C05.1 magic mouthwash w/lidocaine SOLN  2. Squamous cell carcinoma of soft palate (HCC)  C05.1 magic mouthwash w/lidocaine SOLN   Cancer Staging: Cancer Staging Cancer of soft palate ([place]) Staging form: Pharynx - P16 Negative Oropharynx, AJCC 8th Edition - Clinical stage from 01/11/2020: Stage II (cT2, cN0, cM0) - Signed by Eppie Gibson, MD on 01/13/2020  Intent: Curative  Radiation Treatment Dates: 02/22/2020 through 03/21/2020 Site Technique Total Dose (Gy) Dose per Fx (Gy) Completed Fx Beam Energies  Neck: HN IMRT 54/54 2.7 20/20 6X   CHIEF COMPLAINT:  Here for follow-up and surveillance of oral cancer  Narrative:   Deanna Burns presents today for follow-up of radiation to her head/neck (soft palate) completed on 03/21/2020  Pain issues, if any: Patient states she just had oral surgery to file down bone. Using a feeding tube?: N/A Weight changes, if any:  Wt Readings from Last 3 Encounters:  08/24/20 113 lb 2 oz (51.3 kg)  04/23/20 112 lb (50.8 kg)  04/20/20 115 lb 4 oz (52.3 kg)   Swallowing issues, if any: none Smoking or chewing tobacco? 1-2 cigarettes per day Using fluoride trays daily? N/A Last ENT visit was on: 06/06/2020 Saw Dr. Fenton Malling:  "--Impression: NED, normal post RT sequelae --Plan: She is to see Dr Isidore Moos in Feb. I will see in May. She will discuss lymphedema therapy with Dr Isidore Moos when she sees her next.  --I do not think she needs imaging, just physical exam. I can examine with [place] if concerns arise."  Other notable issues, if any: lymphedema in neck and patient has completed physical therapy. Patient states that she has been given instruction on how to manage  lymphedema.   Vitals:   08/24/20 1455  BP: (!) 147/74  Pulse: 79  Resp: 18  Temp: (!) 97.1 F (36.2 C)  SpO2: 90%                       ALLERGIES:  is allergic to aspirin, latex, penicillins, and sulfa antibiotics.  Meds: Current Outpatient Medications  Medication Sig Dispense Refill  . albuterol (PROVENTIL HFA;VENTOLIN HFA) 108 (90 BASE) MCG/ACT inhaler Inhale 2 puffs into the lungs every 6 (six) hours as needed. For shortness of breath.    . ALPRAZolam (XANAX) 0.25 MG tablet Take 0.125 mg by mouth every morning.    Marland Kitchen aluminum hydroxide-magnesium carbonate (GAVISCON) 95-358 MG/15ML SUSP Take 15 mLs by mouth as needed.    . bisoprolol (ZEBETA) 5 MG tablet Take 2.5 mg by mouth daily.     . clindamycin (CLEOCIN) 150 MG capsule Take 150 mg by mouth 3 (three) times daily.    . cycloSPORINE (RESTASIS) 0.05 % ophthalmic emulsion Place 1 drop into both eyes 2 (two) times daily.    Marland Kitchen gabapentin (NEURONTIN) 300 MG capsule Take 1 capsule TID to reduce pain. 90 capsule 1  . HYDROcodone-acetaminophen (NORCO) 5-325 MG tablet Take 1-2 tablets by mouth every 6 (six) hours as needed. 12 tablet 0  . ibuprofen (ADVIL,MOTRIN) 200 MG tablet Take 200-400 mg by mouth every 6 (six) hours as needed. For pain.    Marland Kitchen  lidocaine (XYLOCAINE) 2 % solution Patient: Mix 1part 2% viscous lidocaine, 1part H20. Swish or apply as needed to mouth, up to QID. 200 mL 2  . LORazepam (ATIVAN) 0.5 MG tablet Take 1 tablet 30 min before PET scan or Radiotherapy PRN claustrophobia. 15 tablet 0  . montelukast (SINGULAIR) 10 MG tablet Take 10 mg by mouth at bedtime.    . mucosal barrier oral (GELCLAIR) GEL Mix 1 packet with 61mL water and swish/ gargle for 60 sec. Spit out. Do not eat or drink for 1 hour after. Use up to TID, prn mucosal pain. 21 packet 8  . ondansetron (ZOFRAN) 8 MG tablet Take 1 tablet (8 mg total) by mouth every 8 (eight) hours as needed for nausea or vomiting. 20 tablet 3  . polyethylene glycol (MIRALAX /  GLYCOLAX) 17 g packet Take 17 g by mouth daily as needed.    . sodium fluoride (PREVIDENT 5000 PLUS) 1.1 % CREA dental cream Apply to tooth brush. Brush teeth for 2 minutes. Spit out excess-DO NOT swallow. DO NOT rinse afterwards. Repeat nightly. 51 g prn  . Umeclidinium-Vilanterol (ANORO ELLIPTA IN) Inhale into the lungs.    Marland Kitchen XIIDRA 5 % SOLN     . magic mouthwash w/lidocaine SOLN 1 part maalox plus (cherry preferred), 1 part nystatin, 1 part benadryl, 3 parts 2% viscous lidocaine. Swish 73mL for 60 seconds, then spit. Use 4-8 times daily. 500 mL 5   No current facility-administered medications for this encounter.    Physical Findings: The patient is in no acute distress. Patient is alert and oriented. Wt Readings from Last 3 Encounters:  08/24/20 113 lb 2 oz (51.3 kg)  04/23/20 112 lb (50.8 kg)  04/20/20 115 lb 4 oz (52.3 kg)    height is 5\' 2"  (1.575 m) and weight is 113 lb 2 oz (51.3 kg). Her temporal temperature is 97.1 F (36.2 C) (abnormal). Her blood pressure is 147/74 (abnormal) and her pulse is 79. Her respiration is 18 and oxygen saturation is 90%. .  General: Alert and oriented, in no acute distress, she presents in a wheelchair HEENT: Head is normocephalic. Extraocular movements are intact. Oropharynx and oral cavity without visible tumor. No thrush Neck: Neck is notable for healing skin, no palpable masses in the cervical or supraclavicular regions Skin: Skin in treatment fields shows satisfactory healing   Psychiatric: Judgment and insight are intact. Affect is appropriate.   Lab Findings: Lab Results  Component Value Date   WBC 10.0 02/14/2019   HGB 16.0 (H) 02/14/2019   HCT 49.4 (H) 02/14/2019   MCV 97.2 02/14/2019   PLT 330 02/14/2019    No results found for: TSH  Radiographic Findings: No results found.  Impression/Plan:    1) Head and Neck Cancer Status: NED.    2) Nutritional Status: stable   Wt Readings from Last 3 Encounters:  08/24/20 113 lb 2 oz  (51.3 kg)  04/23/20 112 lb (50.8 kg)  04/20/20 115 lb 4 oz (52.3 kg)    3) Risk Factors: The patient has been educated about risk factors including alcohol and tobacco abuse; they understand that avoidance of alcohol and tobacco is important to prevent recurrences as well as other cancers.  She is still smoking but she has cut back significantly.  I applauded her on cutting back and informed her again of the importance of stopping smoking.   4) Swallowing: Functional, continue instructions from speech-language pathology  5) Dental: Encouraged to continue regular followup with dentistry.  6) Thyroid function: Screen for hypothyroidism annually - check TSH at next visit. Baseline TSH 2.77 at [facility] 11-07-19. No results found for: TSH  7)  We discussed measures to reduce the risk of infection during the COVID-19 pandemic. She has been boosted.  8) Other: Patient will follow up with ENT as scheduled and I will see her in August.  She was encouraged to perform lymphedema neck massage at home as instructed by PT.    On date of service, in total, I spent 25 minutes on this encounter. Patient was seen in person. _____________________________________   Eppie Gibson, MD

## 2021-03-01 ENCOUNTER — Other Ambulatory Visit: Payer: Self-pay

## 2021-03-01 ENCOUNTER — Ambulatory Visit: Payer: Medicare Other

## 2021-03-01 ENCOUNTER — Telehealth: Payer: Self-pay | Admitting: *Deleted

## 2021-03-01 ENCOUNTER — Encounter: Payer: Self-pay | Admitting: Radiation Oncology

## 2021-03-01 ENCOUNTER — Ambulatory Visit
Admission: RE | Admit: 2021-03-01 | Discharge: 2021-03-01 | Disposition: A | Discharge status: Home / Self Care | Payer: Medicare Other | Source: Ambulatory Visit | Attending: Radiation Oncology | Admitting: Radiation Oncology

## 2021-03-01 VITALS — BP 136/73 | HR 79 | Temp 97.9°F | Resp 18 | Ht 63.0 in | Wt 116.0 lb

## 2021-03-01 DIAGNOSIS — R5383 Other fatigue: Secondary | ICD-10-CM | POA: Insufficient documentation

## 2021-03-01 DIAGNOSIS — Z85819 Personal history of malignant neoplasm of unspecified site of lip, oral cavity, and pharynx: Secondary | ICD-10-CM | POA: Diagnosis present

## 2021-03-01 DIAGNOSIS — Z923 Personal history of irradiation: Secondary | ICD-10-CM | POA: Diagnosis not present

## 2021-03-01 DIAGNOSIS — Z79899 Other long term (current) drug therapy: Secondary | ICD-10-CM | POA: Insufficient documentation

## 2021-03-01 DIAGNOSIS — C051 Malignant neoplasm of soft palate: Secondary | ICD-10-CM

## 2021-03-01 NOTE — Telephone Encounter (Signed)
Called patient to inform that she doesn't need to come today for lab, only her fu appt. with Dr. Isidore Moos @ 2:20 pm, lvm for a return call

## 2021-03-01 NOTE — Progress Notes (Signed)
Deanna Burns presents today for follow-up of radiation to her head/neck (soft palate) completed on 03/21/2020  Pain issues, if any: Continues to deal with mouth  Using a feeding tube?: N/A Weight changes, if any: Reports a stable appetite Wt Readings from Last 3 Encounters:  03/01/21 116 lb (52.6 kg)  08/24/20 113 lb 2 oz (51.3 kg)  04/23/20 112 lb (50.8 kg)   Swallowing issues, if any: Occasionally reports occasionally getting choked when eating meat or drier foods. But otherwsie denies any issues Smoking or chewing tobacco? None Using fluoride trays daily? N/A--planning on seeing Dr. Benson Norway to have cracked tooth on right side of mouth assessed in October Last ENT visit was on: 10/08/2020 Saw Dr. Fenton Malling:  "Salient findings:  --TMs intact and middle ears well aerated --Mobile vocal folds --Neck soft without pathologic nodes or masses --Tongue mobile and soft without mucosal lesions or submucosal masses --Palate smooth and without lesions.  --Left mandible has a 1cm x 1cm section of exposed necrotic bone.  --CNs 2-12 intact.   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 She will discuss with Dr Benson Norway.  I will recheck 4 months. Seeing Dr. Isidore Moos later this month.   Other notable issues, if any: Denies any ear or jaw pain (aside from exposed mandible) or difficulty opening her mouth. Denies any symptoms/signs of swelling to chin or neck. Reports fatigue but manages with daily naps.

## 2021-03-01 NOTE — Progress Notes (Signed)
Radiation Oncology         [phone] ________________________________  Name: Deanna Burns MRN: [id number]  Date: 03/01/2021  DOB: Jul 11, 1945  Follow-Up Visit Note  CC: Rusty Aus, MD  Rusty Aus, MD  Diagnosis and Prior Radiotherapy:       ICD-10-CM   1. Cancer of soft palate (HCC)  C05.1      Cancer Staging: Cancer Staging Cancer of soft palate ([place]) Staging form: Pharynx - P16 Negative Oropharynx, AJCC 8th Edition - Clinical stage from 01/11/2020: Stage II (cT2, cN0, cM0) - Signed by Eppie Gibson, MD on 01/13/2020  Intent: Curative  Radiation Treatment Dates: 02/22/2020 through 03/21/2020 Site Technique Total Dose (Gy) Dose per Fx (Gy) Completed Fx Beam Energies  Neck: HN IMRT 54/54 2.7 20/20 6X   CHIEF COMPLAINT:   Ms. Woolf presents today for follow-up of radiation to her head/neck (soft palate) completed on 03/21/2020  Pain issues, if any: Continues to deal with mouth  Using a feeding tube?: N/A Weight changes, if any: Reports a stable appetite Wt Readings from Last 3 Encounters:  03/01/21 116 lb (52.6 kg)  08/24/20 113 lb 2 oz (51.3 kg)  04/23/20 112 lb (50.8 kg)   Swallowing issues, if any: Occasionally reports occasionally getting choked when eating meat or drier foods. But otherwsie denies any issues Smoking or chewing tobacco? None Using fluoride trays daily? N/A--planning on seeing Dr. Benson Norway to have cracked tooth on right side of mouth assessed in October Last ENT visit was on: 10/08/2020 Saw Dr. Fenton Malling:  "Salient findings:  --TMs intact and middle ears well aerated --Mobile vocal folds --Neck soft without pathologic nodes or masses --Tongue mobile and soft without mucosal lesions or submucosal masses --Palate smooth and without lesions.  --Left mandible has a 1cm x 1cm section of exposed necrotic bone.  --CNs 2-12 intact.   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 She will discuss with Dr Benson Norway.  I will recheck 4 months. Seeing Dr. Isidore Moos later this month.   Other notable issues, if any: Denies any ear or jaw pain (aside from exposed mandible) or difficulty opening her mouth. Denies any symptoms/signs of swelling to chin or neck. Reports fatigue but manages with daily naps.     Vitals:   03/01/21 1424  BP: 136/73  Pulse: 79  Resp: 18  Temp: 97.9 F (36.6 C)  SpO2: 95%                       ALLERGIES:  is allergic to aspirin, latex, penicillins, and sulfa antibiotics.  Meds: Current Outpatient Medications  Medication Sig Dispense Refill   albuterol (PROVENTIL HFA;VENTOLIN HFA) 108 (90 BASE) MCG/ACT inhaler Inhale 2 puffs into the lungs every 6 (six) hours as needed. For shortness of breath.     ALPRAZolam (XANAX) 0.25 MG tablet Take 0.125 mg by mouth every morning.     aluminum hydroxide-magnesium carbonate (GAVISCON) 95-358 MG/15ML SUSP Take 15 mLs by mouth as needed.     bisoprolol (ZEBETA) 5 MG tablet Take 2.5 mg by mouth daily.      clindamycin (CLEOCIN) 150 MG capsule Take 150 mg by mouth 3 (three) times daily.     cycloSPORINE (RESTASIS) 0.05 % ophthalmic emulsion Place 1 drop into both eyes 2 (two) times daily.     gabapentin (NEURONTIN) 300 MG capsule Take  1 capsule TID to reduce pain. 90 capsule 1   HYDROcodone-acetaminophen (NORCO) 5-325 MG tablet Take 1-2 tablets by mouth every 6 (six) hours as needed. 12 tablet 0   ibuprofen (ADVIL,MOTRIN) 200 MG tablet Take 200-400 mg by mouth every 6 (six) hours as needed. For pain.     lidocaine (XYLOCAINE) 2 % solution Patient: Mix 1part 2% viscous lidocaine, 1part H20. Swish or apply as needed to mouth, up to QID. 200 mL 2   LORazepam (ATIVAN) 0.5 MG tablet Take 1 tablet 30 min before PET scan or Radiotherapy PRN claustrophobia. 15 tablet 0   magic mouthwash w/lidocaine SOLN 1 part maalox plus (cherry preferred), 1 part nystatin, 1  part benadryl, 3 parts 2% viscous lidocaine. Swish 35mL for 60 seconds, then spit. Use 4-8 times daily. 500 mL 5   montelukast (SINGULAIR) 10 MG tablet Take 10 mg by mouth at bedtime.     mucosal barrier oral (GELCLAIR) GEL Mix 1 packet with 24mL water and swish/ gargle for 60 sec. Spit out. Do not eat or drink for 1 hour after. Use up to TID, prn mucosal pain. 21 packet 8   ondansetron (ZOFRAN) 8 MG tablet Take 1 tablet (8 mg total) by mouth every 8 (eight) hours as needed for nausea or vomiting. 20 tablet 3   polyethylene glycol (MIRALAX / GLYCOLAX) 17 g packet Take 17 g by mouth daily as needed.     sodium fluoride (PREVIDENT 5000 PLUS) 1.1 % CREA dental cream Apply to tooth brush. Brush teeth for 2 minutes. Spit out excess-DO NOT swallow. DO NOT rinse afterwards. Repeat nightly. 51 g prn   Umeclidinium-Vilanterol (ANORO ELLIPTA IN) Inhale into the lungs.     XIIDRA 5 % SOLN      No current facility-administered medications for this encounter.    Physical Findings: The patient is in no acute distress. Patient is alert and oriented. Wt Readings from Last 3 Encounters:  03/01/21 116 lb (52.6 kg)  08/24/20 113 lb 2 oz (51.3 kg)  04/23/20 112 lb (50.8 kg)    height is 5\' 3"  (1.6 m) and weight is 116 lb (52.6 kg). Her temporal temperature is 97.9 F (36.6 C). Her blood pressure is 136/73 and her pulse is 79. Her respiration is 18 and oxygen saturation is 95%. .  General: Alert and oriented, in no acute distress, she presents in a wheelchair HEENT: Head is normocephalic. Extraocular movements are intact. Oropharynx and oral cavity without visible tumor. No thrush.  There is exposed bone through the gum in the posterior left mandible Neck no palpable masses in the cervical or supraclavicular regions Skin: Skin in treatment fields shows satisfactory healing   Psychiatric: Judgment and insight are intact. Affect is appropriate. Lungs: distant breath sounds Heart : regular in rate and  rhythm   Lab Findings: Lab Results  Component Value Date   WBC 10.0 02/14/2019   HGB 16.0 (H) 02/14/2019   HCT 49.4 (H) 02/14/2019   MCV 97.2 02/14/2019   PLT 330 02/14/2019    No results found for: TSH  Radiographic Findings: No results found.  Impression/Plan:    1) Head and Neck Cancer Status: NED.  However she is coping with dental issues and exposed bone in the posterior left mandible.  She follows up in the next several weeks with Dr. Frederik Schmidt and Dr. Macky Lower.  Will refer to wound care center for consideration of hyperbaric oxygen and notify them that they may want to coordinate care with  the physicians above if either of them decide to remove bone fragments.  2) Nutritional Status: stable   Wt Readings from Last 3 Encounters:  03/01/21 116 lb (52.6 kg)  08/24/20 113 lb 2 oz (51.3 kg)  04/23/20 112 lb (50.8 kg)    3) Risk Factors: The patient has been educated about risk factors including alcohol and tobacco abuse; they understand that avoidance of alcohol and tobacco is important to prevent recurrences as well as other cancers.   Today she denies smoking or chewing tobacco  4) Swallowing: Functional, continue instructions from speech-language pathology  5) Dental: Coping with broken teeth and exposed bone at mandible ; encouraged to continue regular followup with dentistry and her oral surgeon as scheduled.  6) Thyroid function: Screen for hypothyroidism annually -   she is doing this with an outside doctor  7)  I will see her back in 6 months, sooner if needed    On date of service, in total, I spent 25 minutes on this encounter. Patient was seen in person. _____________________________________   Eppie Gibson, MD

## 2021-03-04 ENCOUNTER — Other Ambulatory Visit: Payer: Self-pay

## 2021-03-04 DIAGNOSIS — C051 Malignant neoplasm of soft palate: Secondary | ICD-10-CM

## 2021-07-16 ENCOUNTER — Telehealth: Payer: Self-pay | Admitting: *Deleted

## 2021-07-16 NOTE — Telephone Encounter (Signed)
RETURNED PATIENT'S PHONE CALL, SPOKE WITH PATIENT. ?

## 2021-08-29 ENCOUNTER — Telehealth: Payer: Self-pay

## 2021-08-29 NOTE — Telephone Encounter (Signed)
Received VM from patient concerned about swollen glands and headaches that she has been dealing with for about 2 weeks. Requested return call to let her know if Dr. Isidore Moos would like to see her, or if she should reach out to a different provider.   Returned patient's call to let her know that Dr. Isidore Moos was out of the office today, and it would be more appropriate for her to reach out to her ENT (Dr. Fenton Malling) or her PCP. Patient replied "well Dr. Isidore Moos always feels my glands, so I thought she would want to know". Acknowledged that while a physical assessment/exam is part of her routine F/U's here in the clinic, unless radiation was the recommended treatment, Dr. Isidore Moos would not be the most appropriate provider to manage her concerns. Patient then replied that she would go to an Urgent Care or [facility] since she was not optimistic that she would be seen by one of her other providers same day. Attempted to acknowledged patient's feelings and encourage her to call me back if she should have any other questions/concerns, but patient ended call.

## 2021-08-30 ENCOUNTER — Ambulatory Visit: Payer: Self-pay | Admitting: Radiation Oncology

## 2021-08-30 ENCOUNTER — Telehealth: Payer: Self-pay | Admitting: *Deleted

## 2021-08-30 NOTE — Telephone Encounter (Signed)
Called patient to ask about setting up a fu with Dr. Isidore Moos, patient stated that she would be seeing Dr. Owens Shark next week and if she needs to come here after that she'll call me

## 2021-09-24 ENCOUNTER — Telehealth: Payer: Self-pay | Admitting: *Deleted

## 2021-09-24 NOTE — Telephone Encounter (Signed)
RETURNED PATIENT'S PHONE CALL, SPOKE WITH PATIENT. ?

## 2021-09-27 ENCOUNTER — Ambulatory Visit: Payer: Medicare Other | Admitting: Radiation Oncology

## 2021-10-23 ENCOUNTER — Other Ambulatory Visit: Payer: Self-pay

## 2021-10-23 ENCOUNTER — Telehealth: Payer: Self-pay

## 2021-10-23 DIAGNOSIS — C787 Secondary malignant neoplasm of liver and intrahepatic bile duct: Secondary | ICD-10-CM

## 2021-10-23 NOTE — Telephone Encounter (Signed)
Received VM from Dr. Jimmy Footman at [facility] regarding patient's recent liver biopsy results. He is recommending Carboplatin/Etoposide, but patient is not established with a medical oncologist here at [facility]. He provided his direct call-back number and requested a return call from Dr. Isidore Moos to discuss patient's case, and place urgent MedOnc referral. Message relayed to Dr. Isidore Moos ?

## 2021-10-24 ENCOUNTER — Telehealth: Payer: Self-pay | Admitting: Nurse Practitioner

## 2021-10-24 NOTE — Telephone Encounter (Signed)
Scheduled appt per 4/19 referral. Pt is aware of appt date and time. Pt is aware to arrive 15 mins prior to appt time and to bring and updated insurance card. Pt is aware of appt location.   ?

## 2021-10-29 ENCOUNTER — Telehealth: Payer: Self-pay | Admitting: Hematology and Oncology

## 2021-10-29 NOTE — Telephone Encounter (Signed)
Rescheduled appointment per provider speciality. Patient aware of new appointment day and time.  ?

## 2021-10-30 ENCOUNTER — Inpatient Hospital Stay: Payer: Medicare Other | Admitting: Nurse Practitioner

## 2021-10-31 ENCOUNTER — Inpatient Hospital Stay: Payer: Medicare Other

## 2021-10-31 ENCOUNTER — Inpatient Hospital Stay: Payer: Medicare Other | Attending: Nurse Practitioner | Admitting: Hematology and Oncology

## 2021-10-31 ENCOUNTER — Encounter: Payer: Self-pay | Admitting: Hematology and Oncology

## 2021-10-31 ENCOUNTER — Other Ambulatory Visit: Payer: Self-pay | Admitting: *Deleted

## 2021-10-31 ENCOUNTER — Other Ambulatory Visit: Payer: Self-pay

## 2021-10-31 VITALS — BP 124/67 | HR 79 | Temp 97.5°F | Resp 18 | Ht 63.0 in | Wt 110.5 lb

## 2021-10-31 DIAGNOSIS — Z79899 Other long term (current) drug therapy: Secondary | ICD-10-CM | POA: Insufficient documentation

## 2021-10-31 DIAGNOSIS — C7A1 Malignant poorly differentiated neuroendocrine tumors: Secondary | ICD-10-CM

## 2021-10-31 DIAGNOSIS — Z7189 Other specified counseling: Secondary | ICD-10-CM

## 2021-10-31 DIAGNOSIS — C7B02 Secondary carcinoid tumors of liver: Secondary | ICD-10-CM | POA: Diagnosis present

## 2021-10-31 DIAGNOSIS — G893 Neoplasm related pain (acute) (chronic): Secondary | ICD-10-CM | POA: Diagnosis not present

## 2021-10-31 DIAGNOSIS — C7A00 Malignant carcinoid tumor of unspecified site: Secondary | ICD-10-CM | POA: Diagnosis not present

## 2021-10-31 DIAGNOSIS — Z9981 Dependence on supplemental oxygen: Secondary | ICD-10-CM | POA: Insufficient documentation

## 2021-10-31 DIAGNOSIS — Z85818 Personal history of malignant neoplasm of other sites of lip, oral cavity, and pharynx: Secondary | ICD-10-CM | POA: Insufficient documentation

## 2021-10-31 DIAGNOSIS — C3490 Malignant neoplasm of unspecified part of unspecified bronchus or lung: Secondary | ICD-10-CM | POA: Diagnosis not present

## 2021-10-31 DIAGNOSIS — F17210 Nicotine dependence, cigarettes, uncomplicated: Secondary | ICD-10-CM | POA: Diagnosis not present

## 2021-10-31 LAB — COMPREHENSIVE METABOLIC PANEL
ALT: 78 U/L — ABNORMAL HIGH (ref 0–44)
AST: 120 U/L — ABNORMAL HIGH (ref 15–41)
Albumin: 4 g/dL (ref 3.5–5.0)
Alkaline Phosphatase: 352 U/L — ABNORMAL HIGH (ref 38–126)
Anion gap: 7 (ref 5–15)
BUN: 22 mg/dL (ref 8–23)
CO2: 43 mmol/L — ABNORMAL HIGH (ref 22–32)
Calcium: 10.8 mg/dL — ABNORMAL HIGH (ref 8.9–10.3)
Chloride: 93 mmol/L — ABNORMAL LOW (ref 98–111)
Creatinine, Ser: 0.62 mg/dL (ref 0.44–1.00)
GFR, Estimated: >60 mL/min (ref >60)
Glucose, Bld: 111 mg/dL — ABNORMAL HIGH (ref 70–99)
Potassium: 4.4 mmol/L (ref 3.5–5.1)
Sodium: 143 mmol/L (ref 135–145)
Total Bilirubin: 0.5 mg/dL (ref 0.3–1.2)
Total Protein: 7.2 g/dL (ref 6.5–8.1)

## 2021-10-31 LAB — CBC WITH DIFFERENTIAL/PLATELET
Abs Immature Granulocytes: 0.08 10*3/uL — ABNORMAL HIGH (ref 0.00–0.07)
Basophils Absolute: 0.1 10*3/uL (ref 0.0–0.1)
Basophils Relative: 1 %
Eosinophils Absolute: 0 10*3/uL (ref 0.0–0.5)
Eosinophils Relative: 0 %
HCT: 43 % (ref 36.0–46.0)
Hemoglobin: 12.9 g/dL (ref 12.0–15.0)
Immature Granulocytes: 1 %
Lymphocytes Relative: 4 %
Lymphs Abs: 0.4 10*3/uL — ABNORMAL LOW (ref 0.7–4.0)
MCH: 30.3 pg (ref 26.0–34.0)
MCHC: 30 g/dL (ref 30.0–36.0)
MCV: 100.9 fL — ABNORMAL HIGH (ref 80.0–100.0)
Monocytes Absolute: 0.6 10*3/uL (ref 0.1–1.0)
Monocytes Relative: 7 %
Neutro Abs: 8 10*3/uL — ABNORMAL HIGH (ref 1.7–7.7)
Neutrophils Relative %: 87 %
Platelets: 283 10*3/uL (ref 150–400)
RBC: 4.26 MIL/uL (ref 3.87–5.11)
RDW: 13 % (ref 11.5–15.5)
WBC: 9.2 10*3/uL (ref 4.0–10.5)
nRBC: 0 % (ref 0.0–0.2)

## 2021-10-31 MED ORDER — OXYCODONE HCL 5 MG PO TABS: 5.0000 mg | ORAL_TABLET | Freq: Four times a day (QID) | ORAL | 0 refills | Status: AC | PRN

## 2021-10-31 NOTE — Progress Notes (Signed)
[place] ?CONSULT NOTE ? ?Patient Care Team: ?Rusty Aus, MD as PCP - General (Unknown Physician Specialty) ?Eppie Gibson, MD as Attending Physician (Radiation Oncology) ?Malmfelt, Stephani Police, RN as Oncology Nurse Navigator ?Sharen Counter, CCC-SLP as Psychologist, clinical (Speech Pathology) ?Karie Mainland, RD as Dietitian (Nutrition) ?Wynelle Beckmann, Melodie Bouillon, PT as Physical Therapist (Physical Therapy) ?Beverely Pace, LCSW (Inactive) as Education officer, museum Hotel manager) ?Elmore, Abigail P, LCSW (Inactive) as Education officer, museum ? ?CHIEF COMPLAINTS/PURPOSE OF CONSULTATION:  ?Poorly differentiated neuroendocrine tumor ? ?ASSESSMENT & PLAN:  ?No problem-specific Assessment & Plan notes found for this encounter. ? ?Orders Placed This Encounter  ?Procedures  ? NM PET Image Restag (PS) Skull Base To Thigh  ?  Standing Status:   Future  ?  Standing Expiration Date:   10/31/2022  ?  Order Specific Question:   If indicated for the ordered procedure, I authorize the administration of a radiopharmaceutical per Radiology protocol  ?  Answer:   Yes  ?  Order Specific Question:   Preferred imaging location?  ?  Answer:   [facility]  ? MR Brain W Wo Contrast  ?  Standing Status:   Future  ?  Standing Expiration Date:   10/31/2022  ?  Order Specific Question:   If indicated for the ordered procedure, I authorize the administration of contrast media per Radiology protocol  ?  Answer:   Yes  ?  Order Specific Question:   What is the patient's sedation requirement?  ?  Answer:   No Sedation  ?  Order Specific Question:   Does the patient have a pacemaker or implanted devices?  ?  Answer:   No  ?  Order Specific Question:   Use SRS Protocol?  ?  Answer:   No  ?  Order Specific Question:   Preferred imaging location?  ?  Answer:   [facility] (table limit - 550 lbs)  ? CBC with Differential/Platelet  ?  Standing Status:   Standing  ?  Number of Occurrences:   68  ?  Standing Expiration Date:    11/01/2022  ? Comprehensive metabolic panel  ?  Standing Status:   Standing  ?  Number of Occurrences:   77  ?  Standing Expiration Date:   11/01/2022  ? 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  Pathology from the liver showed poorly differentiated carcinoma with neuroendocrine features not representing squamous cell carcinoma.  Biopsy from the lymph node shows degenerated cells in a background of necrosis suspicious for carcinoma, thought to be likely recurrent squamous cell carcinoma. ? ?She had a PET scan almost 2 months ago.  She was seen by Dr. Jimmy Footman at [facility] and his recommendation was to consider first-line carbo etoposide with atezolizumab with growth factor support. ?We have discussed that poorly differentiated neuroendocrine carcinomas are aggressive cancers and tend to metastasize hematogenously.  We do not know if this is small cell versus large cell but that typically they are both treated in a similar manner.  She understands that the tumor will metastasize rapidly and although may respond to chemotherapy very well is incurable and tend to relapse.  There is no role for surgery nor radiation.  Cisplatin with etoposide versus carboplatin with etoposide is frontline regimen.  We have discussed  that median survival is approximately a year in patients with this particular kind of tumors.  I do not believe she is a candidate for cisplatin.  She appears too frail.  Although there is nationwide shortage of carboplatin, we will try to obtain carboplatin for her.  We have also discussed about adding the immunotherapy frontline.  I briefly discussed about adverse effects of chemotherapy including but not limited to fatigue, nausea, vomiting, diarrhea, increased risk of infections, neuropathy.  I also discussed about  mechanism of action of immunotherapy and adverse effects of immunotherapy which can commonly include fatigue, skin rash, hypo-/hyperthyroidism, hepatitis, diarrhea etc.  She understands that in a small percentage of patients immunotherapy can affect any organ and can be fatal. ?Dr. Marily Memos has already asked for NexGen sequencing as well as PD-L1 expression. ? ?Although there is concern for local recurrence of the squamous cell carcinoma of the palate, we have discussed that this poorly differentiated neuroendocrine tumor takes priority and will be the cause of death, hence we will focus on treating this.  Chemotherapy with carboplatin and etoposide may also simultaneously treat the recurrent squamous cell carcinoma of the palate although this is not the ideal regimen. ? ?She did not have an MRI brain.  I ordered PET/CT as well as MRI brain. ?Start chemoimmunotherapy ASAP.  If her performance status deteriorates, recommended palliative care since this is an aggressive tumor and is not curable.  She understands and had no questions.  She already finished a formal chemo teach at [facility] hence we will defer this. ? ?For cancer related pain, refilled her oxycodone and requested she take it every 6 hours as needed for pain. ? ?HISTORY OF PRESENTING ILLNESS:  ? ?MALEA SWILLING 76 y.o. 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 Isidore Moos recently noticed lump in her left neck for the past 2 to 3 months.  She underwent is further imaging of the CT neck and CT chest.  There was found to be 30 mm heterogeneous mass with irregular margins suspicious for extranodal extension of the left level 2 nodal recurrence.  The mass appears to encase branches of the left external  carotid artery and to about the left proximal internal carotid artery along a greater than 150 degree but less than 180 degree segment.  The mass compresses the left internal jugular vein and is inseparable from the posterior margin of the left submandibular gland.   ? ?She also had a PET/CT which showed right and left hepatic lobe lesions, largest lesion in the central right and left hepatic lobe measuring 5.9 x 5.4 cm.  2 smaller hypermetabolic lesions also noted in the lateral segment of the left hepatic lobe measuring about 10 mm each.  Her third lesion in the lateral segment of left hepatic lobe was also present.  3 small foci of intense metabolic activity within the skeleton present.  For example small lesion in the T4 vertebral body with SUV max equals 4.3.  Small lesion in the S1 vertebral body midline SUV max equals 4.6.  Similar lesion in the right sacral alla with a small sclerotic focus. ? ?Ultrasound-guided FNA and core needle biopsy of the right hepatic lobe mass showed malignant cells present consistent with poorly differentiated carcinoma.  Findings in the liver  were not consistent with metastatic squamous cell carcinoma.  The findings in this biopsy are most consistent with poorly differentiated carcinoma with neuroendocrine features.  She also had a left cervical node biopsy ? ?Pathology from the left neck biopsy showed degenerated cells in a background of necrosis suspicious for carcinoma.  Abundant necrosis with scant viable cells.  Given patient's history of oral cavity squamous cell carcinoma features are highly likely to represent a necrotic malignancy. ? ?She was seen by Dr. Jimmy Footman at [facility] on 10/28/2021.  She reports back pain and has been taking oxycodone about 3 times a day.  She has also been taking some ibuprofen but this causes her stomach to be upset.  And she does not want to take Tylenol because she is worried about the liver dysfunction.  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  She is otherwise able to walk around in the house from the bathroom to the bedroom.  She is not very energetic, feels very tired.  No cough, che

## 2021-10-31 NOTE — Progress Notes (Signed)
START OFF PATHWAY REGIMEN - Other ° ° °[id number]:Atezolizumab D1 + Carboplatin D1 + Etoposide  IV D1-3 q21 Days x 4 Cycles Followed by Atezolizumab D1 q21 Days: °  Cycles 1 through 4, every 21 days: °    Atezolizumab  °    Carboplatin  °    Etoposide  °  Cycles 5 and beyond, every 21 days: °    Atezolizumab  ° °**Always confirm dose/schedule in your pharmacy ordering system** ° °Patient Characteristics: °Intent of Therapy: °Non-Curative / Palliative Intent, Discussed with Patient °

## 2021-11-01 ENCOUNTER — Telehealth: Payer: Self-pay | Admitting: Hematology and Oncology

## 2021-11-01 ENCOUNTER — Other Ambulatory Visit: Payer: Self-pay | Admitting: Hematology and Oncology

## 2021-11-01 ENCOUNTER — Ambulatory Visit (HOSPITAL_BASED_OUTPATIENT_CLINIC_OR_DEPARTMENT_OTHER)
Admission: RE | Admit: 2021-11-01 | Discharge: 2021-11-01 | Disposition: A | Discharge status: Home / Self Care | Payer: Medicare Other | Source: Ambulatory Visit | Attending: Hematology and Oncology | Admitting: Hematology and Oncology

## 2021-11-01 DIAGNOSIS — C7A1 Malignant poorly differentiated neuroendocrine tumors: Secondary | ICD-10-CM | POA: Diagnosis present

## 2021-11-01 DIAGNOSIS — C3490 Malignant neoplasm of unspecified part of unspecified bronchus or lung: Secondary | ICD-10-CM

## 2021-11-01 MED ORDER — GADOBUTROL 1 MMOL/ML IV SOLN
5.0000 mL | Freq: Once | INTRAVENOUS | Status: AC | PRN
  Administered 2021-11-01: 5 mL via INTRAVENOUS
  Filled 2021-11-01: qty 6

## 2021-11-01 NOTE — Telephone Encounter (Signed)
Scheduled appointment per 04/27 los. Patient aware.  ?

## 2021-11-01 NOTE — Progress Notes (Signed)
Pharmacist Chemotherapy Monitoring - Initial Assessment   ? ?Anticipated start date: 11/06/21  ? ?The following has been reviewed per standard work regarding the patient's treatment regimen: ?The patient's diagnosis, treatment plan and drug doses, and organ/hematologic function ?Lab orders and baseline tests specific to treatment regimen  ?The treatment plan start date, drug sequencing, and pre-medications ?Prior authorization status  ?Patient's documented medication list, including drug-drug interaction screen and prescriptions for anti-emetics and supportive care specific to the treatment regimen ?The drug concentrations, fluid compatibility, administration routes, and timing of the medications to be used ?The patient's access for treatment and lifetime cumulative dose history, if applicable  ?The patient's medication allergies and previous infusion related reactions, if applicable  ? ?Changes made to treatment plan:  ?treatment plan date ? ?Follow up needed:  ?Pending authorization for treatment  and prescriptions needed for anti-emetics ? ? ?Kennith Center, Pharm.D., CPP ?11/01/2021@3 :46 PM ? ? ? ?

## 2021-11-04 ENCOUNTER — Encounter (HOSPITAL_COMMUNITY)
Admission: RE | Admit: 2021-11-04 | Discharge: 2021-11-04 | Disposition: A | Discharge status: Home / Self Care | Payer: Medicare Other | Source: Ambulatory Visit | Attending: Hematology and Oncology | Admitting: Hematology and Oncology

## 2021-11-04 DIAGNOSIS — C7A1 Malignant poorly differentiated neuroendocrine tumors: Secondary | ICD-10-CM | POA: Insufficient documentation

## 2021-11-04 DIAGNOSIS — C3490 Malignant neoplasm of unspecified part of unspecified bronchus or lung: Secondary | ICD-10-CM | POA: Insufficient documentation

## 2021-11-04 LAB — GLUCOSE, CAPILLARY: Glucose-Capillary: 130 mg/dL — ABNORMAL HIGH (ref 70–99)

## 2021-11-04 MED ORDER — FLUDEOXYGLUCOSE F - 18 (FDG) INJECTION
5.4000 | Freq: Once | INTRAVENOUS | Status: AC
  Administered 2021-11-04: 5.4 via INTRAVENOUS

## 2021-11-05 ENCOUNTER — Ambulatory Visit: Payer: Medicare Other | Admitting: Hematology and Oncology

## 2021-11-06 ENCOUNTER — Inpatient Hospital Stay: Payer: Medicare Other

## 2021-11-06 ENCOUNTER — Telehealth: Payer: Self-pay | Admitting: *Deleted

## 2021-11-06 ENCOUNTER — Inpatient Hospital Stay: Payer: Medicare Other | Attending: Hematology and Oncology

## 2021-11-06 ENCOUNTER — Other Ambulatory Visit: Payer: Self-pay

## 2021-11-06 ENCOUNTER — Other Ambulatory Visit: Payer: Self-pay | Admitting: Hematology and Oncology

## 2021-11-06 ENCOUNTER — Other Ambulatory Visit: Payer: Self-pay | Admitting: *Deleted

## 2021-11-06 VITALS — BP 108/73 | HR 86 | Temp 97.7°F | Resp 20

## 2021-11-06 DIAGNOSIS — Z79899 Other long term (current) drug therapy: Secondary | ICD-10-CM | POA: Diagnosis not present

## 2021-11-06 DIAGNOSIS — C7B02 Secondary carcinoid tumors of liver: Secondary | ICD-10-CM | POA: Diagnosis present

## 2021-11-06 DIAGNOSIS — C7A1 Malignant poorly differentiated neuroendocrine tumors: Secondary | ICD-10-CM

## 2021-11-06 DIAGNOSIS — Z5111 Encounter for antineoplastic chemotherapy: Secondary | ICD-10-CM | POA: Diagnosis present

## 2021-11-06 DIAGNOSIS — C7A00 Malignant carcinoid tumor of unspecified site: Secondary | ICD-10-CM | POA: Diagnosis present

## 2021-11-06 LAB — COMPREHENSIVE METABOLIC PANEL
ALT: 96 U/L — ABNORMAL HIGH (ref 0–44)
AST: 204 U/L — ABNORMAL HIGH (ref 15–41)
Albumin: 3.5 g/dL (ref 3.5–5.0)
Alkaline Phosphatase: 474 U/L — ABNORMAL HIGH (ref 38–126)
Anion gap: 12 (ref 5–15)
BUN: 48 mg/dL — ABNORMAL HIGH (ref 8–23)
CO2: 37 mmol/L — ABNORMAL HIGH (ref 22–32)
Calcium: 11.5 mg/dL — ABNORMAL HIGH (ref 8.9–10.3)
Chloride: 93 mmol/L — ABNORMAL LOW (ref 98–111)
Creatinine, Ser: 1.17 mg/dL — ABNORMAL HIGH (ref 0.44–1.00)
GFR, Estimated: 49 mL/min — ABNORMAL LOW (ref >60)
Glucose, Bld: 103 mg/dL — ABNORMAL HIGH (ref 70–99)
Potassium: 4.7 mmol/L (ref 3.5–5.1)
Sodium: 142 mmol/L (ref 135–145)
Total Bilirubin: 1.2 mg/dL (ref 0.3–1.2)
Total Protein: 7.2 g/dL (ref 6.5–8.1)

## 2021-11-06 LAB — CBC WITH DIFFERENTIAL/PLATELET
Abs Immature Granulocytes: 0.19 10*3/uL — ABNORMAL HIGH (ref 0.00–0.07)
Basophils Absolute: 0.1 10*3/uL (ref 0.0–0.1)
Basophils Relative: 0 %
Eosinophils Absolute: 0 10*3/uL (ref 0.0–0.5)
Eosinophils Relative: 0 %
HCT: 46.1 % — ABNORMAL HIGH (ref 36.0–46.0)
Hemoglobin: 13.8 g/dL (ref 12.0–15.0)
Immature Granulocytes: 1 %
Lymphocytes Relative: 5 %
Lymphs Abs: 0.8 10*3/uL (ref 0.7–4.0)
MCH: 30.2 pg (ref 26.0–34.0)
MCHC: 29.9 g/dL — ABNORMAL LOW (ref 30.0–36.0)
MCV: 100.9 fL — ABNORMAL HIGH (ref 80.0–100.0)
Monocytes Absolute: 1 10*3/uL (ref 0.1–1.0)
Monocytes Relative: 6 %
Neutro Abs: 14.1 10*3/uL — ABNORMAL HIGH (ref 1.7–7.7)
Neutrophils Relative %: 88 %
Platelets: 301 10*3/uL (ref 150–400)
RBC: 4.57 MIL/uL (ref 3.87–5.11)
RDW: 14 % (ref 11.5–15.5)
WBC: 16.1 10*3/uL — ABNORMAL HIGH (ref 4.0–10.5)
nRBC: 0.3 % — ABNORMAL HIGH (ref 0.0–0.2)

## 2021-11-06 MED ORDER — SODIUM CHLORIDE 0.9 % IV SOLN
235.6000 mg | Freq: Once | INTRAVENOUS | Status: AC
  Administered 2021-11-06: 240 mg via INTRAVENOUS
  Filled 2021-11-06: qty 24

## 2021-11-06 MED ORDER — PROCHLORPERAZINE MALEATE 5 MG PO TABS: 5.0000 mg | ORAL_TABLET | Freq: Four times a day (QID) | ORAL | 0 refills | Status: AC | PRN

## 2021-11-06 MED ORDER — SODIUM CHLORIDE 0.9 % IV SOLN
150.0000 mg | Freq: Once | INTRAVENOUS | Status: AC
  Administered 2021-11-06: 150 mg via INTRAVENOUS
  Filled 2021-11-06: qty 150

## 2021-11-06 MED ORDER — SODIUM CHLORIDE 0.9 % IV SOLN: Freq: Once | INTRAVENOUS | Status: AC

## 2021-11-06 MED ORDER — SODIUM CHLORIDE 0.9 % IV SOLN
10.0000 mg | Freq: Once | INTRAVENOUS | Status: AC
  Administered 2021-11-06: 10 mg via INTRAVENOUS
  Filled 2021-11-06: qty 10

## 2021-11-06 MED ORDER — SODIUM CHLORIDE 0.9 % IV SOLN
1200.0000 mg | Freq: Once | INTRAVENOUS | Status: AC
  Administered 2021-11-06: 1200 mg via INTRAVENOUS
  Filled 2021-11-06: qty 20

## 2021-11-06 MED ORDER — PALONOSETRON HCL INJECTION 0.25 MG/5ML
0.2500 mg | Freq: Once | INTRAVENOUS | Status: AC
  Administered 2021-11-06: 0.25 mg via INTRAVENOUS
  Filled 2021-11-06: qty 5

## 2021-11-06 MED ORDER — SODIUM CHLORIDE 0.9 % IV SOLN
40.0000 mg/m2 | Freq: Once | INTRAVENOUS | Status: AC
  Administered 2021-11-06: 60 mg via INTRAVENOUS
  Filled 2021-11-06: qty 3

## 2021-11-06 NOTE — Patient Instructions (Signed)
[place]  Discharge Instructions: ?Thank you for choosing [facility] to provide your oncology and hematology care.  ? ?If you have a lab appointment with the [facility], please go directly to the [facility] and check in at the registration area. ?  ?Wear comfortable clothing and clothing appropriate for easy access to any Portacath or PICC line.  ? ?We strive to give you quality time with your provider. You may need to reschedule your appointment if you arrive late (15 or more minutes).  Arriving late affects you and other patients whose appointments are after yours.  Also, if you miss three or more appointments without notifying the office, you may be dismissed from the clinic at the provider?s discretion.    ?  ?For prescription refill requests, have your pharmacy contact our office and allow 72 hours for refills to be completed.   ? ?Today you received the following chemotherapy and/or immunotherapy agents tecentriq, carboplatin, etoposide    ?  ?To help prevent nausea and vomiting after your treatment, we encourage you to take your nausea medication as directed. ? ?BELOW ARE SYMPTOMS THAT SHOULD BE REPORTED IMMEDIATELY: ?*FEVER GREATER THAN 100.4 F (38 ?C) OR HIGHER ?*CHILLS OR SWEATING ?*NAUSEA AND VOMITING THAT IS NOT CONTROLLED WITH YOUR NAUSEA MEDICATION ?*UNUSUAL SHORTNESS OF BREATH ?*UNUSUAL BRUISING OR BLEEDING ?*URINARY PROBLEMS (pain or burning when urinating, or frequent urination) ?*BOWEL PROBLEMS (unusual diarrhea, constipation, pain near the anus) ?TENDERNESS IN MOUTH AND THROAT WITH OR WITHOUT PRESENCE OF ULCERS (sore throat, sores in mouth, or a toothache) ?UNUSUAL RASH, SWELLING OR PAIN  ?UNUSUAL VAGINAL DISCHARGE OR ITCHING  ? ?Items with * indicate a potential emergency and should be followed up as soon as possible or go to the Emergency Department if any problems should occur. ? ?Please show the CHEMOTHERAPY ALERT CARD or IMMUNOTHERAPY  ALERT CARD at check-in to the Emergency Department and triage nurse. ? ?Should you have questions after your visit or need to cancel or reschedule your appointment, please contact [facility]  Dept: [phone]  and follow the prompts.  Office hours are 8:00 a.m. to 4:30 p.m. Monday - Friday. Please note that voicemails left after 4:00 p.m. may not be returned until the following business day.  We are closed weekends and major holidays. You have access to a nurse at all times for urgent questions. Please call the main number to the clinic Dept: [phone] and follow the prompts. ? ? ?For any non-urgent questions, you may also contact your provider using MyChart. We now offer e-Visits for anyone 22 and older to request care online for non-urgent symptoms. For details visit [email or web address]. ?  ?Also download the MyChart app! Go to the app store, search "MyChart", open the app, select [facility], and log in with your MyChart username and password. ? ?Due to Covid, a mask is required upon entering the hospital/clinic. If you do not have a mask, one will be given to you upon arrival. For doctor visits, patients may have 1 support person aged 94 or older with them. For treatment visits, patients cannot have anyone with them due to current Covid guidelines and our immunocompromised population.  ? ? ?Atezolizumab injection ?What is this medication? ?ATEZOLIZUMAB (a te zoe LIZ ue mab) is a monoclonal antibody. It is used to treat bladder cancer (urothelial cancer), liver cancer, lung cancer, and melanoma. ?This medicine may be used for other purposes; ask your health care provider or  pharmacist if you have questions. ?COMMON BRAND NAME(S): Tecentriq ?What should I tell my care team before I take this medication? ?They need to know if you have any of these conditions: ?autoimmune diseases like Crohn's disease, ulcerative colitis, or lupus ?have had or planning to have an allogeneic  stem cell transplant (uses someone else's stem cells) ?history of organ transplant ?history of radiation to the chest ?nervous system problems like myasthenia gravis or Guillain-Barre syndrome ?an unusual or allergic reaction to atezolizumab, other medicines, foods, dyes, or preservatives ?pregnant or trying to get pregnant ?breast-feeding ?How should I use this medication? ?This medicine is for infusion into a vein. It is given by a health care professional in a hospital or clinic setting. ?A special MedGuide will be given to you before each treatment. Be sure to read this information carefully each time. ?Talk to your pediatrician regarding the use of this medicine in children. Special care may be needed. ?Overdosage: If you think you have taken too much of this medicine contact a poison control center or emergency room at once. ?NOTE: This medicine is only for you. Do not share this medicine with others. ?What if I miss a dose? ?It is important not to miss your dose. Call your doctor or health care professional if you are unable to keep an appointment. ?What may interact with this medication? ?Interactions have not been studied. ?This list may not describe all possible interactions. Give your health care provider a list of all the medicines, herbs, non-prescription drugs, or dietary supplements you use. Also tell them if you smoke, drink alcohol, or use illegal drugs. Some items may interact with your medicine. ?What should I watch for while using this medication? ?Your condition will be monitored carefully while you are receiving this medicine. ?You may need blood work done while you are taking this medicine. ?Do not become pregnant while taking this medicine or for at least 5 months after stopping it. Women should inform their doctor if they wish to become pregnant or think they might be pregnant. There is a potential for serious side effects to an unborn child. Talk to your health care professional or  pharmacist for more information. Do not breast-feed an infant while taking this medicine or for at least 5 months after the last dose. ?What side effects may I notice from receiving this medication? ?Side effects that you should report to your doctor or health care professional as soon as possible: ?allergic reactions like skin rash, itching or hives, swelling of the face, lips, or tongue ?black, tarry stools ?bloody or watery diarrhea ?breathing problems ?changes in vision ?chest pain or chest tightness ?chills ?facial flushing ?fever ?headache ?signs and symptoms of high blood sugar such as dizziness; dry mouth; dry skin; fruity breath; nausea; stomach pain; increased hunger or thirst; increased urination ?signs and symptoms of liver injury like dark yellow or brown urine; general ill feeling or flu-like symptoms; light-colored stools; loss of appetite; nausea; right upper belly pain; unusually weak or tired; yellowing of the eyes or skin ?stomach pain ?trouble passing urine or change in the amount of urine ?Side effects that usually do not require medical attention (report to your doctor or health care professional if they continue or are bothersome): ?bone pain ?cough ?diarrhea ?joint pain ?muscle pain ?muscle weakness ?swelling of arms or legs ?tiredness ?weight loss ?This list may not describe all possible side effects. Call your doctor for medical advice about side effects. You may report side effects to FDA at  1-800-FDA-1088. ?Where should I keep my medication? ?This drug is given in a hospital or clinic and will not be stored at home. ?NOTE: This sheet is a summary. It may not cover all possible information. If you have questions about this medicine, talk to your doctor, pharmacist, or health care provider. ?? 2023 Elsevier/Gold Standard (2021-05-24 00:00:00) ? ?Carboplatin injection ?What is this medication? ?CARBOPLATIN (KAR boe pla tin) is a chemotherapy drug. It targets fast dividing cells, like cancer  cells, and causes these cells to die. This medicine is used to treat ovarian cancer and many other cancers. ?This medicine may be used for other purposes; ask your health care provider or pharmacist if you have qu

## 2021-11-06 NOTE — Telephone Encounter (Signed)
This RN did modified teaching on medications to be administered today - Carboplatin,VP-16 and Tencentriq including administration and side effects. ? ?Note pt had chemo education at [facility] prior to transferring care to this office. ? ?Printed information given to pt and her significant other- Deanna Burns ( lives in same home ) including chemo card ( one for each of them). ? ?Of note pt did inquire about "getting the cooling cap"- per side effect of alopecia - with this RN informing her per diagnosis and treatment plan would not respond well for benefit. Verbal support given per issue of hair loss. ? ?Pt in agreement to treatment plan- questions answered by this RN from patient and Deanna Burns (S.O) with pt signing consent. ? ? ?

## 2021-11-06 NOTE — Progress Notes (Signed)
Ok to proceed with elevated AST/ALT per Dr.Iruku ?

## 2021-11-07 ENCOUNTER — Inpatient Hospital Stay: Payer: Medicare Other

## 2021-11-07 ENCOUNTER — Encounter: Payer: Self-pay | Admitting: Hematology and Oncology

## 2021-11-07 ENCOUNTER — Other Ambulatory Visit: Payer: Self-pay | Admitting: *Deleted

## 2021-11-07 DIAGNOSIS — Z79899 Other long term (current) drug therapy: Secondary | ICD-10-CM

## 2021-11-07 DIAGNOSIS — C779 Secondary and unspecified malignant neoplasm of lymph node, unspecified: Secondary | ICD-10-CM | POA: Diagnosis present

## 2021-11-07 DIAGNOSIS — Z833 Family history of diabetes mellitus: Secondary | ICD-10-CM

## 2021-11-07 DIAGNOSIS — J9621 Acute and chronic respiratory failure with hypoxia: Secondary | ICD-10-CM | POA: Diagnosis present

## 2021-11-07 DIAGNOSIS — Z880 Allergy status to penicillin: Secondary | ICD-10-CM

## 2021-11-07 DIAGNOSIS — Z91040 Latex allergy status: Secondary | ICD-10-CM

## 2021-11-07 DIAGNOSIS — Z809 Family history of malignant neoplasm, unspecified: Secondary | ICD-10-CM

## 2021-11-07 DIAGNOSIS — F17210 Nicotine dependence, cigarettes, uncomplicated: Secondary | ICD-10-CM | POA: Diagnosis present

## 2021-11-07 DIAGNOSIS — C7A1 Malignant poorly differentiated neuroendocrine tumors: Secondary | ICD-10-CM | POA: Diagnosis present

## 2021-11-07 DIAGNOSIS — Z515 Encounter for palliative care: Secondary | ICD-10-CM | POA: Diagnosis not present

## 2021-11-07 DIAGNOSIS — G9341 Metabolic encephalopathy: Secondary | ICD-10-CM | POA: Diagnosis present

## 2021-11-07 DIAGNOSIS — C051 Malignant neoplasm of soft palate: Secondary | ICD-10-CM

## 2021-11-07 DIAGNOSIS — Z886 Allergy status to analgesic agent status: Secondary | ICD-10-CM

## 2021-11-07 DIAGNOSIS — Z66 Do not resuscitate: Secondary | ICD-10-CM | POA: Diagnosis present

## 2021-11-07 DIAGNOSIS — E875 Hyperkalemia: Secondary | ICD-10-CM | POA: Diagnosis present

## 2021-11-07 DIAGNOSIS — J9622 Acute and chronic respiratory failure with hypercapnia: Principal | ICD-10-CM | POA: Diagnosis present

## 2021-11-07 DIAGNOSIS — I10 Essential (primary) hypertension: Secondary | ICD-10-CM | POA: Diagnosis present

## 2021-11-07 DIAGNOSIS — Z882 Allergy status to sulfonamides status: Secondary | ICD-10-CM

## 2021-11-07 DIAGNOSIS — G9340 Encephalopathy, unspecified: Secondary | ICD-10-CM | POA: Diagnosis not present

## 2021-11-07 DIAGNOSIS — I959 Hypotension, unspecified: Secondary | ICD-10-CM | POA: Diagnosis present

## 2021-11-07 DIAGNOSIS — N179 Acute kidney failure, unspecified: Secondary | ICD-10-CM | POA: Diagnosis present

## 2021-11-07 DIAGNOSIS — E7220 Disorder of urea cycle metabolism, unspecified: Secondary | ICD-10-CM | POA: Diagnosis present

## 2021-11-07 DIAGNOSIS — J449 Chronic obstructive pulmonary disease, unspecified: Secondary | ICD-10-CM | POA: Diagnosis present

## 2021-11-07 MED ORDER — SODIUM CHLORIDE 0.9 % IV SOLN
10.0000 mg | Freq: Once | INTRAVENOUS | Status: AC
  Administered 2021-11-07: 10 mg via INTRAVENOUS
  Filled 2021-11-07: qty 10

## 2021-11-07 MED ORDER — SODIUM CHLORIDE 0.9 % IV SOLN: INTRAVENOUS | Status: AC

## 2021-11-07 MED ORDER — SODIUM CHLORIDE 0.9 % IV SOLN
40.0000 mg/m2 | Freq: Once | INTRAVENOUS | Status: AC
  Administered 2021-11-07: 60 mg via INTRAVENOUS
  Filled 2021-11-07: qty 3

## 2021-11-07 MED ORDER — SODIUM CHLORIDE 0.9 % IV SOLN: Freq: Once | INTRAVENOUS | Status: DC

## 2021-11-07 NOTE — Patient Instructions (Addendum)
[facility]  Discharge Instructions: ?Thank you for choosing [facility] to provide your oncology and hematology care.  ? ?If you have a lab appointment with the [facility], please go directly to the [facility] and check in at the registration area. ?  ?Wear comfortable clothing and clothing appropriate for easy access to any Portacath or PICC line.  ? ?We strive to give you quality time with your provider. You may need to reschedule your appointment if you arrive late (15 or more minutes).  Arriving late affects you and other patients whose appointments are after yours.  Also, if you miss three or more appointments without notifying the office, you may be dismissed from the clinic at the provider?s discretion.    ?  ?For prescription refill requests, have your pharmacy contact our office and allow 72 hours for refills to be completed.   ? ?Today you received the following chemotherapy and/or immunotherapy agents: Etoposide ?  ?To help prevent nausea and vomiting after your treatment, we encourage you to take your nausea medication as directed. ? ?BELOW ARE SYMPTOMS THAT SHOULD BE REPORTED IMMEDIATELY: ?*FEVER GREATER THAN 100.4 F (38 ?C) OR HIGHER ?*CHILLS OR SWEATING ?*NAUSEA AND VOMITING THAT IS NOT CONTROLLED WITH YOUR NAUSEA MEDICATION ?*UNUSUAL SHORTNESS OF BREATH ?*UNUSUAL BRUISING OR BLEEDING ?*URINARY PROBLEMS (pain or burning when urinating, or frequent urination) ?*BOWEL PROBLEMS (unusual diarrhea, constipation, pain near the anus) ?TENDERNESS IN MOUTH AND THROAT WITH OR WITHOUT PRESENCE OF ULCERS (sore throat, sores in mouth, or a toothache) ?UNUSUAL RASH, SWELLING OR PAIN  ?UNUSUAL VAGINAL DISCHARGE OR ITCHING  ? ?Items with * indicate a potential emergency and should be followed up as soon as possible or go to the Emergency Department if any problems should occur. ? ?Please show the CHEMOTHERAPY ALERT CARD or IMMUNOTHERAPY ALERT CARD at check-in to the  Emergency Department and triage nurse. ? ?Should you have questions after your visit or need to cancel or reschedule your appointment, please contact [facility]  Dept: [phone]  and follow the prompts.  Office hours are 8:00 a.m. to 4:30 p.m. Monday - Friday. Please note that voicemails left after 4:00 p.m. may not be returned until the following business day.  We are closed weekends and major holidays. You have access to a nurse at all times for urgent questions. Please call the main number to the clinic Dept: [phone] and follow the prompts. ? ? ?For any non-urgent questions, you may also contact your provider using MyChart. We now offer e-Visits for anyone 61 and older to request care online for non-urgent symptoms. For details visit [email or web address]. ?  ?Also download the MyChart app! Go to the app store, search "MyChart", open the app, select [facility], and log in with your MyChart username and password. ? ?Due to Covid, a mask is required upon entering the hospital/clinic. If you do not have a mask, one will be given to you upon arrival. For doctor visits, patients may have 1 support person aged 36 or older with them. For treatment visits, patients cannot have anyone with them due to current Covid guidelines and our immunocompromised population.  ? ?Rehydration, Adult ?Rehydration is the replacement of body fluids, salts, and minerals (electrolytes) that are lost during dehydration. Dehydration is when there is not enough water or other fluids in the body. This happens when you lose more fluids than you take in. Common causes of dehydration include: ?Not drinking enough fluids.  This can occur when you are ill or doing activities that require a lot of energy, especially in hot weather. ?Conditions that cause loss of water or other fluids, such as diarrhea, vomiting, sweating, or urinating a lot. ?Other illnesses, such as fever or infection. ?Certain medicines,  such as those that remove excess fluid from the body (diuretics). ?Symptoms of mild or moderate dehydration may include thirst, dry lips and mouth, and dizziness. Symptoms of severe dehydration may include increased heart rate, confusion, fainting, and not urinating. ?For severe dehydration, you may need to get fluids through an IV at the hospital. For mild or moderate dehydration, you can usually rehydrate at home by drinking certain fluids as told by your health care provider. ?What are the risks? ?Generally, rehydration is safe. However, taking in too much fluid (overhydration) can be a problem. This is rare. Overhydration can cause an electrolyte imbalance, kidney failure, or a decrease in salt (sodium) levels in the body. ?Supplies needed ?You will need an oral rehydration solution (ORS) if your health care provider tells you to use one. This is a drink to treat dehydration. It can be found in pharmacies and retail stores. ?How to rehydrate ?Fluids ?Follow instructions from your health care provider for rehydration. The kind of fluid and the amount you should drink depend on your condition. In general, you should choose drinks that you prefer. ?If told by your health care provider, drink an ORS. ?Make an ORS by following instructions on the package. ?Start by drinking small amounts, about ? cup (120 mL) every 5-10 minutes. ?Slowly increase how much you drink until you have taken the amount recommended by your health care provider. ?Drink enough clear fluids to keep your urine pale yellow. If you were told to drink an ORS, finish it first, then start slowly drinking other clear fluids. Drink fluids such as: ?Water. This includes sparkling water and flavored water. Drinking only water can lead to having too little sodium in your body (hyponatremia). Follow the advice of your health care provider. ?Water from ice chips you suck on. ?Fruit juice with water you add to it (diluted). ?Sports drinks. ?Hot or cold  herbal teas. ?Broth-based soups. ?Milk or milk products. ?Food ?Follow instructions from your health care provider about what to eat while you rehydrate. Your health care provider may recommend that you slowly begin eating regular foods in small amounts. ?Eat foods that contain a healthy balance of electrolytes, such as bananas, oranges, potatoes, tomatoes, and spinach. ?Avoid foods that are greasy or contain a lot of sugar. ?In some cases, you may get nutrition through a feeding tube that is passed through your nose and into your stomach (nasogastric tube, or NG tube). This may be done if you have uncontrolled vomiting or diarrhea. ?Beverages to avoid ? ?Certain beverages may make dehydration worse. While you rehydrate, avoid drinking alcohol. ?How to tell if you are recovering from dehydration ?You may be recovering from dehydration if: ?You are urinating more often than before you started rehydrating. ?Your urine is pale yellow. ?Your energy level improves. ?You vomit less frequently. ?You have diarrhea less frequently. ?Your appetite improves or returns to normal. ?You feel less dizzy or less light-headed. ?Your skin tone and color start to look more normal. ?Follow these instructions at home: ?Take over-the-counter and prescription medicines only as told by your health care provider. ?Do not take sodium tablets. Doing this can lead to having too much sodium in your body (hypernatremia). ?Contact a health care provider  if: ?You continue to have symptoms of mild or moderate dehydration, such as: ?Thirst. ?Dry lips. ?Slightly dry mouth. ?Dizziness. ?Dark urine or less urine than normal. ?Muscle cramps. ?You continue to vomit or have diarrhea. ?Get help right away if you: ?Have symptoms of dehydration that get worse. ?Have a fever. ?Have a severe headache. ?Have been vomiting and the following happens: ?Your vomiting gets worse or does not go away. ?Your vomit includes blood or green matter (bile). ?You cannot eat  or drink without vomiting. ?Have problems with urination or bowel movements, such as: ?Diarrhea that gets worse or does not go away. ?Blood in your stool (feces). This may cause stool to look black and tarry

## 2021-11-08 ENCOUNTER — Inpatient Hospital Stay: Payer: Medicare Other

## 2021-11-08 ENCOUNTER — Other Ambulatory Visit: Payer: Self-pay

## 2021-11-08 VITALS — BP 98/56 | HR 88 | Temp 98.4°F | Resp 20

## 2021-11-08 DIAGNOSIS — C7A1 Malignant poorly differentiated neuroendocrine tumors: Secondary | ICD-10-CM

## 2021-11-08 MED ORDER — SODIUM CHLORIDE 0.9 % IV SOLN: Freq: Once | INTRAVENOUS | Status: AC

## 2021-11-08 MED ORDER — SODIUM CHLORIDE 0.9 % IV SOLN
40.0000 mg/m2 | Freq: Once | INTRAVENOUS | Status: AC
  Administered 2021-11-08: 60 mg via INTRAVENOUS
  Filled 2021-11-08: qty 3

## 2021-11-08 MED ORDER — SODIUM CHLORIDE 0.9 % IV SOLN
10.0000 mg | Freq: Once | INTRAVENOUS | Status: AC
  Administered 2021-11-08: 10 mg via INTRAVENOUS
  Filled 2021-11-08: qty 10

## 2021-11-08 NOTE — Patient Instructions (Signed)
[facility]  Discharge Instructions: Thank you for choosing [facility] to provide your oncology and hematology care.   If you have a lab appointment with the Cancer Center, please go directly to the Cancer Center and check in at the registration area.   Wear comfortable clothing and clothing appropriate for easy access to any Portacath or PICC line.   We strive to give you quality time with your provider. You may need to reschedule your appointment if you arrive late (15 or more minutes).  Arriving late affects you and other patients whose appointments are after yours.  Also, if you miss three or more appointments without notifying the office, you may be dismissed from the clinic at the provider's discretion.      For prescription refill requests, have your pharmacy contact our office and allow 72 hours for refills to be completed.    Today you received the following chemotherapy and/or immunotherapy agents: etoposide.     To help prevent nausea and vomiting after your treatment, we encourage you to take your nausea medication as directed.  BELOW ARE SYMPTOMS THAT SHOULD BE REPORTED IMMEDIATELY: . *FEVER GREATER THAN 100.4 F (38 C) OR HIGHER . *CHILLS OR SWEATING . *NAUSEA AND VOMITING THAT IS NOT CONTROLLED WITH YOUR NAUSEA MEDICATION . *UNUSUAL SHORTNESS OF BREATH . *UNUSUAL BRUISING OR BLEEDING . *URINARY PROBLEMS (pain or burning when urinating, or frequent urination) . *BOWEL PROBLEMS (unusual diarrhea, constipation, pain near the anus) . TENDERNESS IN MOUTH AND THROAT WITH OR WITHOUT PRESENCE OF ULCERS (sore throat, sores in mouth, or a toothache) . UNUSUAL RASH, SWELLING OR PAIN  . UNUSUAL VAGINAL DISCHARGE OR ITCHING   Items with * indicate a potential emergency and should be followed up as soon as possible or go to the Emergency Department if any problems should occur.  Please show the CHEMOTHERAPY ALERT CARD or IMMUNOTHERAPY ALERT  CARD at check-in to the Emergency Department and triage nurse.  Should you have questions after your visit or need to cancel or reschedule your appointment, please contact [facility]  Dept: [phone]  and follow the prompts.  Office hours are 8:00 a.m. to 4:30 p.m. Monday - Friday. Please note that voicemails left after 4:00 p.m. may not be returned until the following business day.  We are closed weekends and major holidays. You have access to a nurse at all times for urgent questions. Please call the main number to the clinic Dept: [phone] and follow the prompts.   For any non-urgent questions, you may also contact your provider using MyChart. We now offer e-Visits for anyone 18 and older to request care online for non-urgent symptoms. For details visit [email or web address].   Also download the MyChart app! Go to the app store, search "MyChart", open the app, select , and log in with your MyChart username and password.  Due to Covid, a mask is required upon entering the hospital/clinic. If you do not have a mask, one will be given to you upon arrival. For doctor visits, patients may have 1 support person aged 18 or older with them. For treatment visits, patients cannot have anyone with them due to current Covid guidelines and our immunocompromised population.   

## 2021-11-09 ENCOUNTER — Other Ambulatory Visit: Payer: Self-pay

## 2021-11-09 ENCOUNTER — Emergency Department (HOSPITAL_COMMUNITY): Payer: Medicare Other

## 2021-11-09 ENCOUNTER — Inpatient Hospital Stay (HOSPITAL_COMMUNITY)
Admission: EM | Admit: 2021-11-09 | Discharge: 2021-12-05 | DRG: 951 | Disposition: E | Payer: Medicare Other | Attending: Family Medicine | Admitting: Family Medicine

## 2021-11-09 DIAGNOSIS — C779 Secondary and unspecified malignant neoplasm of lymph node, unspecified: Secondary | ICD-10-CM | POA: Diagnosis present

## 2021-11-09 DIAGNOSIS — Z882 Allergy status to sulfonamides status: Secondary | ICD-10-CM

## 2021-11-09 DIAGNOSIS — G9340 Encephalopathy, unspecified: Principal | ICD-10-CM

## 2021-11-09 DIAGNOSIS — C7A1 Malignant poorly differentiated neuroendocrine tumors: Secondary | ICD-10-CM | POA: Diagnosis present

## 2021-11-09 DIAGNOSIS — I959 Hypotension, unspecified: Secondary | ICD-10-CM | POA: Diagnosis present

## 2021-11-09 DIAGNOSIS — F17210 Nicotine dependence, cigarettes, uncomplicated: Secondary | ICD-10-CM | POA: Diagnosis present

## 2021-11-09 DIAGNOSIS — Z886 Allergy status to analgesic agent status: Secondary | ICD-10-CM

## 2021-11-09 DIAGNOSIS — Z79899 Other long term (current) drug therapy: Secondary | ICD-10-CM

## 2021-11-09 DIAGNOSIS — I10 Essential (primary) hypertension: Secondary | ICD-10-CM | POA: Diagnosis present

## 2021-11-09 DIAGNOSIS — Z66 Do not resuscitate: Secondary | ICD-10-CM | POA: Diagnosis present

## 2021-11-09 DIAGNOSIS — J9602 Acute respiratory failure with hypercapnia: Secondary | ICD-10-CM

## 2021-11-09 DIAGNOSIS — Z515 Encounter for palliative care: Principal | ICD-10-CM

## 2021-11-09 DIAGNOSIS — E875 Hyperkalemia: Secondary | ICD-10-CM | POA: Diagnosis present

## 2021-11-09 DIAGNOSIS — N179 Acute kidney failure, unspecified: Secondary | ICD-10-CM | POA: Diagnosis present

## 2021-11-09 DIAGNOSIS — E7220 Disorder of urea cycle metabolism, unspecified: Secondary | ICD-10-CM | POA: Diagnosis present

## 2021-11-09 DIAGNOSIS — C7951 Secondary malignant neoplasm of bone: Secondary | ICD-10-CM | POA: Diagnosis present

## 2021-11-09 DIAGNOSIS — E8729 Other acidosis: Secondary | ICD-10-CM | POA: Diagnosis present

## 2021-11-09 DIAGNOSIS — J449 Chronic obstructive pulmonary disease, unspecified: Secondary | ICD-10-CM | POA: Diagnosis present

## 2021-11-09 DIAGNOSIS — Z833 Family history of diabetes mellitus: Secondary | ICD-10-CM

## 2021-11-09 DIAGNOSIS — J9622 Acute and chronic respiratory failure with hypercapnia: Secondary | ICD-10-CM | POA: Diagnosis present

## 2021-11-09 DIAGNOSIS — Z91040 Latex allergy status: Secondary | ICD-10-CM

## 2021-11-09 DIAGNOSIS — C7B8 Other secondary neuroendocrine tumors: Secondary | ICD-10-CM | POA: Diagnosis present

## 2021-11-09 DIAGNOSIS — Z809 Family history of malignant neoplasm, unspecified: Secondary | ICD-10-CM

## 2021-11-09 DIAGNOSIS — C787 Secondary malignant neoplasm of liver and intrahepatic bile duct: Secondary | ICD-10-CM | POA: Diagnosis present

## 2021-11-09 DIAGNOSIS — G9341 Metabolic encephalopathy: Secondary | ICD-10-CM | POA: Diagnosis present

## 2021-11-09 DIAGNOSIS — J9621 Acute and chronic respiratory failure with hypoxia: Secondary | ICD-10-CM | POA: Diagnosis present

## 2021-11-09 DIAGNOSIS — Z880 Allergy status to penicillin: Secondary | ICD-10-CM

## 2021-11-09 LAB — I-STAT VENOUS BLOOD GAS, ED
Acid-base deficit: 1 mmol/L (ref 0.0–2.0)
Bicarbonate: 31.1 mmol/L — ABNORMAL HIGH (ref 20.0–28.0)
Calcium, Ion: 1.33 mmol/L (ref 1.15–1.40)
HCT: 43 % (ref 36.0–46.0)
Hemoglobin: 14.6 g/dL (ref 12.0–15.0)
O2 Saturation: 75 %
Potassium: 5.9 mmol/L — ABNORMAL HIGH (ref 3.5–5.1)
Sodium: 140 mmol/L (ref 135–145)
TCO2: 34 mmol/L — ABNORMAL HIGH (ref 22–32)
pCO2, Ven: 88.3 mmHg (ref 44–60)
pH, Ven: 7.155 — CL (ref 7.25–7.43)
pO2, Ven: 54 mmHg — ABNORMAL HIGH (ref 32–45)

## 2021-11-09 LAB — CBG MONITORING, ED: Glucose-Capillary: 123 mg/dL — ABNORMAL HIGH (ref 70–99)

## 2021-11-09 MED ORDER — SODIUM CHLORIDE 0.9 % IV BOLUS
1000.0000 mL | Freq: Once | INTRAVENOUS | Status: AC
  Administered 2021-11-09: 1000 mL via INTRAVENOUS

## 2021-11-09 MED ORDER — IPRATROPIUM-ALBUTEROL 0.5-2.5 (3) MG/3ML IN SOLN
3.0000 mL | Freq: Once | RESPIRATORY_TRACT | Status: AC
  Administered 2021-11-09: 3 mL via RESPIRATORY_TRACT
  Filled 2021-11-09: qty 3

## 2021-11-09 MED ORDER — NALOXONE HCL 0.4 MG/ML IJ SOLN
0.4000 mg | Freq: Once | INTRAMUSCULAR | Status: AC
  Administered 2021-11-09: 0.4 mg via INTRAVENOUS
  Filled 2021-11-09: qty 1

## 2021-11-09 NOTE — ED Triage Notes (Signed)
Pt bib [facility] after husband called stating pt was increasingly lethargic after taking 5mg  oxy at 10. Pt dx Wed metastatic cancer, decreased appetite and mobility since diagnosis. BP 60/40 initially, 90/50 after 500 ml bolus. 4L at baseline.  ?

## 2021-11-10 ENCOUNTER — Emergency Department (HOSPITAL_COMMUNITY): Payer: Medicare Other

## 2021-11-10 ENCOUNTER — Encounter (HOSPITAL_COMMUNITY): Payer: Self-pay | Admitting: Family Medicine

## 2021-11-10 ENCOUNTER — Other Ambulatory Visit: Payer: Self-pay

## 2021-11-10 DIAGNOSIS — G9340 Encephalopathy, unspecified: Secondary | ICD-10-CM | POA: Diagnosis present

## 2021-11-10 DIAGNOSIS — Z880 Allergy status to penicillin: Secondary | ICD-10-CM | POA: Diagnosis not present

## 2021-11-10 DIAGNOSIS — J9622 Acute and chronic respiratory failure with hypercapnia: Secondary | ICD-10-CM | POA: Diagnosis present

## 2021-11-10 DIAGNOSIS — J449 Chronic obstructive pulmonary disease, unspecified: Secondary | ICD-10-CM | POA: Diagnosis present

## 2021-11-10 DIAGNOSIS — I10 Essential (primary) hypertension: Secondary | ICD-10-CM | POA: Diagnosis present

## 2021-11-10 DIAGNOSIS — Z515 Encounter for palliative care: Secondary | ICD-10-CM | POA: Diagnosis not present

## 2021-11-10 DIAGNOSIS — C7A1 Malignant poorly differentiated neuroendocrine tumors: Secondary | ICD-10-CM

## 2021-11-10 DIAGNOSIS — E8729 Other acidosis: Secondary | ICD-10-CM | POA: Diagnosis present

## 2021-11-10 DIAGNOSIS — Z886 Allergy status to analgesic agent status: Secondary | ICD-10-CM | POA: Diagnosis not present

## 2021-11-10 DIAGNOSIS — Z79899 Other long term (current) drug therapy: Secondary | ICD-10-CM | POA: Diagnosis not present

## 2021-11-10 DIAGNOSIS — N179 Acute kidney failure, unspecified: Secondary | ICD-10-CM | POA: Diagnosis present

## 2021-11-10 DIAGNOSIS — E7220 Disorder of urea cycle metabolism, unspecified: Secondary | ICD-10-CM | POA: Diagnosis present

## 2021-11-10 DIAGNOSIS — Z91040 Latex allergy status: Secondary | ICD-10-CM | POA: Diagnosis not present

## 2021-11-10 DIAGNOSIS — Z809 Family history of malignant neoplasm, unspecified: Secondary | ICD-10-CM | POA: Diagnosis not present

## 2021-11-10 DIAGNOSIS — J9621 Acute and chronic respiratory failure with hypoxia: Secondary | ICD-10-CM

## 2021-11-10 DIAGNOSIS — F17210 Nicotine dependence, cigarettes, uncomplicated: Secondary | ICD-10-CM | POA: Diagnosis present

## 2021-11-10 DIAGNOSIS — C779 Secondary and unspecified malignant neoplasm of lymph node, unspecified: Secondary | ICD-10-CM | POA: Diagnosis present

## 2021-11-10 DIAGNOSIS — E875 Hyperkalemia: Secondary | ICD-10-CM

## 2021-11-10 DIAGNOSIS — G9341 Metabolic encephalopathy: Secondary | ICD-10-CM | POA: Diagnosis present

## 2021-11-10 DIAGNOSIS — Z882 Allergy status to sulfonamides status: Secondary | ICD-10-CM | POA: Diagnosis not present

## 2021-11-10 DIAGNOSIS — I959 Hypotension, unspecified: Secondary | ICD-10-CM | POA: Diagnosis present

## 2021-11-10 DIAGNOSIS — Z66 Do not resuscitate: Secondary | ICD-10-CM | POA: Diagnosis present

## 2021-11-10 DIAGNOSIS — Z833 Family history of diabetes mellitus: Secondary | ICD-10-CM | POA: Diagnosis not present

## 2021-11-10 LAB — COMPREHENSIVE METABOLIC PANEL
ALT: 120 U/L — ABNORMAL HIGH (ref 0–44)
AST: 295 U/L — ABNORMAL HIGH (ref 15–41)
Albumin: 2.7 g/dL — ABNORMAL LOW (ref 3.5–5.0)
Alkaline Phosphatase: 482 U/L — ABNORMAL HIGH (ref 38–126)
Anion gap: 8 (ref 5–15)
BUN: 83 mg/dL — ABNORMAL HIGH (ref 8–23)
CO2: 28 mmol/L (ref 22–32)
Calcium: 9.8 mg/dL (ref 8.9–10.3)
Chloride: 104 mmol/L (ref 98–111)
Creatinine, Ser: 1.79 mg/dL — ABNORMAL HIGH (ref 0.44–1.00)
GFR, Estimated: 30 mL/min — ABNORMAL LOW (ref >60)
Glucose, Bld: 115 mg/dL — ABNORMAL HIGH (ref 70–99)
Potassium: 6 mmol/L — ABNORMAL HIGH (ref 3.5–5.1)
Sodium: 140 mmol/L (ref 135–145)
Total Bilirubin: 1.5 mg/dL — ABNORMAL HIGH (ref 0.3–1.2)
Total Protein: 5.7 g/dL — ABNORMAL LOW (ref 6.5–8.1)

## 2021-11-10 LAB — URINALYSIS, ROUTINE W REFLEX MICROSCOPIC
Bacteria, UA: NONE SEEN
Bilirubin Urine: NEGATIVE
Glucose, UA: NEGATIVE mg/dL
Ketones, ur: NEGATIVE mg/dL
Leukocytes,Ua: NEGATIVE
Nitrite: NEGATIVE
Protein, ur: 100 mg/dL — AB
Specific Gravity, Urine: 1.017 (ref 1.005–1.030)
pH: 5 (ref 5.0–8.0)

## 2021-11-10 LAB — TROPONIN I (HIGH SENSITIVITY)
Troponin I (High Sensitivity): 42 ng/L — ABNORMAL HIGH (ref <18)
Troponin I (High Sensitivity): 41 ng/L — ABNORMAL HIGH (ref <18)

## 2021-11-10 LAB — CBC WITH DIFFERENTIAL/PLATELET
Abs Immature Granulocytes: 0.6 10*3/uL — ABNORMAL HIGH (ref 0.00–0.07)
Basophils Absolute: 0 10*3/uL (ref 0.0–0.1)
Basophils Relative: 0 %
Eosinophils Absolute: 0 10*3/uL (ref 0.0–0.5)
Eosinophils Relative: 0 %
HCT: 45.5 % (ref 36.0–46.0)
Hemoglobin: 12.7 g/dL (ref 12.0–15.0)
Lymphocytes Relative: 1 %
Lymphs Abs: 0.1 10*3/uL — ABNORMAL LOW (ref 0.7–4.0)
MCH: 30.2 pg (ref 26.0–34.0)
MCHC: 27.9 g/dL — ABNORMAL LOW (ref 30.0–36.0)
MCV: 108.1 fL — ABNORMAL HIGH (ref 80.0–100.0)
Metamyelocytes Relative: 2 %
Monocytes Absolute: 0 10*3/uL — ABNORMAL LOW (ref 0.1–1.0)
Monocytes Relative: 0 %
Myelocytes: 2 %
Neutro Abs: 13.2 10*3/uL — ABNORMAL HIGH (ref 1.7–7.7)
Neutrophils Relative %: 95 %
Platelets: 266 10*3/uL (ref 150–400)
RBC: 4.21 MIL/uL (ref 3.87–5.11)
RDW: 14.2 % (ref 11.5–15.5)
WBC: 13.9 10*3/uL — ABNORMAL HIGH (ref 4.0–10.5)
nRBC: 0 /100 WBC
nRBC: 0.4 % — ABNORMAL HIGH (ref 0.0–0.2)

## 2021-11-10 LAB — I-STAT CHEM 8, ED
BUN: 89 mg/dL — ABNORMAL HIGH (ref 8–23)
Calcium, Ion: 1.26 mmol/L (ref 1.15–1.40)
Chloride: 105 mmol/L (ref 98–111)
Creatinine, Ser: 2.3 mg/dL — ABNORMAL HIGH (ref 0.44–1.00)
Glucose, Bld: 112 mg/dL — ABNORMAL HIGH (ref 70–99)
HCT: 43 % (ref 36.0–46.0)
Hemoglobin: 14.6 g/dL (ref 12.0–15.0)
Potassium: 5.9 mmol/L — ABNORMAL HIGH (ref 3.5–5.1)
Sodium: 141 mmol/L (ref 135–145)
TCO2: 31 mmol/L (ref 22–32)

## 2021-11-10 LAB — PROTIME-INR
INR: 1.2 (ref 0.8–1.2)
Prothrombin Time: 15.4 seconds — ABNORMAL HIGH (ref 11.4–15.2)

## 2021-11-10 LAB — AMMONIA: Ammonia: 75 umol/L — ABNORMAL HIGH (ref 9–35)

## 2021-11-10 LAB — LACTIC ACID, PLASMA: Lactic Acid, Venous: 2.3 mmol/L (ref 0.5–1.9)

## 2021-11-10 LAB — BRAIN NATRIURETIC PEPTIDE: B Natriuretic Peptide: 199.6 pg/mL — ABNORMAL HIGH (ref 0.0–100.0)

## 2021-11-10 MED ORDER — ALBUTEROL SULFATE (2.5 MG/3ML) 0.083% IN NEBU: 2.5000 mg | INHALATION_SOLUTION | RESPIRATORY_TRACT | Status: DC | PRN

## 2021-11-10 MED ORDER — SODIUM CHLORIDE 0.9 % BOLUS PEDS: 1000.0000 mL | Freq: Once | INTRAVENOUS | Status: DC

## 2021-11-10 MED ORDER — ACETAMINOPHEN 650 MG RE SUPP: 650.0000 mg | Freq: Four times a day (QID) | RECTAL | Status: DC | PRN

## 2021-11-10 MED ORDER — ONDANSETRON HCL 4 MG/2ML IJ SOLN: 4.0000 mg | Freq: Four times a day (QID) | INTRAMUSCULAR | Status: DC | PRN

## 2021-11-10 MED ORDER — ATROPINE SULFATE 1 % OP SOLN: 4.0000 [drp] | OPHTHALMIC | Status: DC | PRN

## 2021-11-10 MED ORDER — SODIUM CHLORIDE 0.9 % IV SOLN: INTRAVENOUS | Status: DC

## 2021-11-10 MED ORDER — MORPHINE SULFATE (CONCENTRATE) 10 MG/0.5ML PO SOLN
5.0000 mg | ORAL | Status: DC | PRN
  Filled 2021-11-10: qty 0.5

## 2021-11-10 MED ORDER — LORAZEPAM 2 MG/ML IJ SOLN: 1.0000 mg | INTRAMUSCULAR | Status: DC | PRN

## 2021-11-10 MED ORDER — ONDANSETRON 4 MG PO TBDP: 4.0000 mg | ORAL_TABLET | Freq: Four times a day (QID) | ORAL | Status: DC | PRN

## 2021-11-10 MED ORDER — LORAZEPAM 1 MG PO TABS: 1.0000 mg | ORAL_TABLET | ORAL | Status: DC | PRN

## 2021-11-10 MED ORDER — ALBUTEROL SULFATE (2.5 MG/3ML) 0.083% IN NEBU
10.0000 mg | INHALATION_SOLUTION | RESPIRATORY_TRACT | Status: DC
  Administered 2021-11-10: 10 mg via RESPIRATORY_TRACT
  Filled 2021-11-10: qty 12

## 2021-11-10 MED ORDER — LORAZEPAM 2 MG/ML PO CONC: 1.0000 mg | ORAL | Status: DC | PRN

## 2021-11-10 MED ORDER — OCTREOTIDE ACETATE 100 MCG/ML IJ SOLN
100.0000 ug | Freq: Three times a day (TID) | INTRAMUSCULAR | Status: DC | PRN
  Filled 2021-11-10: qty 1

## 2021-11-10 MED ORDER — POLYVINYL ALCOHOL 1.4 % OP SOLN: 1.0000 [drp] | Freq: Four times a day (QID) | OPHTHALMIC | Status: DC | PRN

## 2021-11-10 MED ORDER — MORPHINE SULFATE (PF) 2 MG/ML IV SOLN
1.0000 mg | INTRAVENOUS | Status: DC | PRN
  Administered 2021-11-10: 1 mg via INTRAVENOUS
  Filled 2021-11-10: qty 1

## 2021-11-10 MED ORDER — ACETAMINOPHEN 325 MG PO TABS: 650.0000 mg | ORAL_TABLET | Freq: Four times a day (QID) | ORAL | Status: DC | PRN

## 2021-11-10 MED ORDER — SODIUM CHLORIDE 0.9 % IV BOLUS
1000.0000 mL | Freq: Once | INTRAVENOUS | Status: AC
  Administered 2021-11-10: 1000 mL via INTRAVENOUS

## 2021-11-10 MED ORDER — ALBUTEROL SULFATE (2.5 MG/3ML) 0.083% IN NEBU
10.0000 mg/h | INHALATION_SOLUTION | RESPIRATORY_TRACT | Status: DC
  Administered 2021-11-10: 10 mg/h via RESPIRATORY_TRACT
  Filled 2021-11-10 (×2): qty 3

## 2021-11-10 MED ORDER — SODIUM CHLORIDE 0.9 % IV SOLN
100.0000 mg | Freq: Two times a day (BID) | INTRAVENOUS | Status: DC
  Administered 2021-11-10: 100 mg via INTRAVENOUS
  Filled 2021-11-10: qty 100

## 2021-11-10 MED ORDER — BIOTENE DRY MOUTH MT LIQD: 15.0000 mL | OROMUCOSAL | Status: DC | PRN

## 2021-11-10 MED ORDER — MORPHINE SULFATE (CONCENTRATE) 10 MG/0.5ML PO SOLN: 5.0000 mg | ORAL | Status: DC | PRN

## 2021-11-10 MED ORDER — SODIUM CHLORIDE 0.9 % IV SOLN
2.0000 g | Freq: Once | INTRAVENOUS | Status: AC
  Administered 2021-11-10: 2 g via INTRAVENOUS
  Filled 2021-11-10: qty 12.5

## 2021-11-10 NOTE — ED Provider Notes (Signed)
?[facility] ?Provider Note ? ? ?CSN: [id number] ?Arrival date & time: 11/29/2021  2331 ? ?  ? ?History ? ?No chief complaint on file. ? ? ?Deanna Burns is a 76 y.o. female. ? ?The history is provided by the EMS personnel, a significant other and medical records.  ? ? Deanna Burns is a 76 y.o. female who presents to the Emergency Department complaining of respiratory distress, altered mental status.  She presents to the ED by EMS for evaluation of AMS.  EMS reports pt with recent diagnosis of metastatic cancer, has been lethargic since Saturday evening, a few hours after receiving oxycodone.   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  She started chemo on Wednesday.  SO states that she had not been doing well for the last few days with poor/minimal oral intake and less activity, becoming weaker.  She began hallucinating and after receiving one of her pain meds she became less responsive.   ? ?EMS report BP 60/40, improved to 90s after IVF.   ? ?Level V caveat due to unreponsiveness ? ?Home Medications ?Prior to Admission medications   ?Medication Sig Start Date End Date Taking? Authorizing Provider  ?albuterol (PROVENTIL HFA;VENTOLIN HFA) 108 (90 BASE) MCG/ACT inhaler Inhale 2 puffs into the lungs every 6 (six) hours as needed. For shortness of breath.   Yes [provider]  ?ALPRAZolam (XANAX) 0.25 MG tablet Take 0.125 mg by mouth every morning.   Yes [provider]  ?aluminum hydroxide-magnesium carbonate (GAVISCON) 95-358 MG/15ML SUSP Take 15 mLs by mouth as needed for indigestion or heartburn.   Yes [provider]  ?bisoprolol (ZEBETA) 5 MG tablet Take 2.5 mg by mouth daily.    Yes [provider]  ?cycloSPORINE (RESTASIS) 0.05 % ophthalmic emulsion Place 1 drop into both eyes 2 (two) times daily.   Yes [provider]  ?gabapentin (NEURONTIN) 300 MG capsule Take 1 capsule TID to reduce pain. ?Patient taking differently: Take 300 mg by mouth 3 (three) times daily. Take 1 capsule TID to reduce pain. 03/13/20  Yes Eppie Gibson, MD  ?lidocaine (XYLOCAINE) 2 % solution Patient: Mix 1part 2% viscous lidocaine, 1part H20. Swish or apply as needed to mouth, up to QID. 07/25/20  Yes Eppie Gibson, MD  ?magic mouthwash w/lidocaine SOLN 1 part maalox plus (cherry preferred), 1 part nystatin, 1 part benadryl, 3 parts 2% viscous lidocaine. Swish 80mL for 60 seconds, then spit. Use 4-8 times daily. 08/24/20  Yes Eppie Gibson, MD  ?montelukast (SINGULAIR) 10 MG tablet Take 10 mg by mouth at bedtime.   Yes [provider]  ?mucosal barrier oral (GELCLAIR) GEL Mix 1 packet with 6mL water and swish/ gargle for 60 sec. Spit out. Do not eat or drink for 1 hour after. Use up to TID, prn mucosal pain. 03/09/20  Yes Eppie Gibson, MD  ?ondansetron (ZOFRAN) 4 MG tablet Take 4 mg by mouth every 8 (eight) hours as needed for nausea or vomiting. 10/22/21  Yes [provider]  ?ondansetron (ZOFRAN) 8 MG tablet Take 1 tablet (8 mg total) by mouth every 8 (eight) hours as needed for nausea or vomiting. 02/27/20  Yes Eppie Gibson, MD  ?oxyCODONE (OXY IR/ROXICODONE) 5 MG immediate release tablet Take 1 tablet (5 mg total) by mouth every 6 (six) hours as needed for severe pain. 10/31/21  Yes Benay Pike, MD  ?polyethylene  glycol (MIRALAX / GLYCOLAX) 17 g packet Take 17 g by mouth daily as needed.   Yes [provider]  ?prochlorperazine (COMPAZINE) 5 MG tablet Take 1 tablet (5 mg total) by mouth every 6 (six) hours as needed for nausea or vomiting. 11/06/21  Yes Benay Pike, MD  ?sodium fluoride (PREVIDENT 5000 PLUS) 1.1 % CREA dental cream Apply to tooth brush. Brush teeth for 2 minutes. Spit out excess-DO NOT swallow. DO NOT rinse afterwards. Repeat nightly. 01/03/20  Yes Lenn Cal, DDS   ?Umeclidinium-Vilanterol (ANORO ELLIPTA IN) Inhale 1 puff into the lungs daily.   Yes [provider]  ?XIIDRA 5 % SOLN Place 1 drop into both eyes daily. 07/02/20  Yes [provider]  ?clindamycin (CLEOCIN) 150 MG capsule Take 150 mg by mouth 3 (three) times daily. ?Patient not taking: Reported on 11/10/2021 08/21/20   [provider]  ?ibuprofen (ADVIL,MOTRIN) 200 MG tablet Take 200-400 mg by mouth every 6 (six) hours as needed. For pain. ?Patient not taking: Reported on 11/10/2021    [provider]  ?LORazepam (ATIVAN) 0.5 MG tablet Take 1 tablet 30 min before PET scan or Radiotherapy PRN claustrophobia. ?Patient not taking: Reported on 11/10/2021 02/27/20   Eppie Gibson, MD  ?   ? ?Allergies    ?Aspirin, Latex, Penicillins, and Sulfa antibiotics   ? ?Review of Systems   ?Review of Systems  ?Unable to perform ROS: Patient unresponsive  ?HENT:  Negative for congestion.   ? ?Physical Exam ?Updated Vital Signs ?BP (!) 113/96   Pulse 93   Temp 98 ?F (36.7 ?C) (Rectal)   Resp (!) 22   SpO2 96%  ?Physical Exam ?Vitals reviewed.  ?Constitutional:   ?   General: She is in acute distress.  ?   Appearance: She is ill-appearing.  ?   Comments: lethargic  ?HENT:  ?   Mouth/Throat:  ?   Mouth: Mucous membranes are dry.  ?Eyes:  ?   Comments: Pupils pinpoint and reactive  ?Neck:  ?   Comments: Firm swelling to the left lateral neck ?Cardiovascular:  ?   Rate and Rhythm: Normal rate.  ?Pulmonary:  ?   Comments: Tachypnea with decreased air movement bilaterally ?Abdominal:  ?   Palpations: Abdomen is soft.  ?   Tenderness: There is no abdominal tenderness. There is no guarding or rebound.  ?Musculoskeletal:  ?   Comments: 2+ pitting edema to BLE.  Cool extremities  ?Skin: ?   General: Skin is dry.  ?   Coloration: Skin is pale.  ?Neurological:  ?   Comments: Unresponsive.  Moans to painful stimuli.  MAE symmetrically but very weakly.    ?Psychiatric:  ?   Comments: Unable to assess.   ? ? ?ED  Results / Procedures / Treatments   ?Labs ?(all labs ordered are listed, but only abnormal results are displayed) ?Labs Reviewed  ?COMPREHENSIVE METABOLIC PANEL - Abnormal; Notable for the following components:  ?    Result Value  ? Potassium 6.0 (*)   ? Glucose, Bld 115 (*)   ? BUN 83 (*)   ? Creatinine, Ser 1.79 (*)   ? Total Protein 5.7 (*)   ? Albumin 2.7 (*)   ? AST 295 (*)   ? ALT 120 (*)   ? Alkaline Phosphatase 482 (*)   ? Total Bilirubin 1.5 (*)   ? GFR, Estimated 30 (*)   ? All other components within normal limits  ?CBC WITH DIFFERENTIAL/PLATELET - Abnormal;  Notable for the following components:  ? WBC 13.9 (*)   ? MCV 108.1 (*)   ? MCHC 27.9 (*)   ? nRBC 0.4 (*)   ? Neutro Abs 13.2 (*)   ? Lymphs Abs 0.1 (*)   ? Monocytes Absolute 0.0 (*)   ? Abs Immature Granulocytes 0.60 (*)   ? All other components within normal limits  ?PROTIME-INR - Abnormal; Notable for the following components:  ? Prothrombin Time 15.4 (*)   ? All other components within normal limits  ?LACTIC ACID, PLASMA - Abnormal; Notable for the following components:  ? Lactic Acid, Venous 2.3 (*)   ? All other components within normal limits  ?AMMONIA - Abnormal; Notable for the following components:  ? Ammonia 75 (*)   ? All other components within normal limits  ?URINALYSIS, ROUTINE W REFLEX MICROSCOPIC - Abnormal; Notable for the following components:  ? Color, Urine AMBER (*)   ? APPearance CLOUDY (*)   ? Hgb urine dipstick SMALL (*)   ? Protein, ur 100 (*)   ? Non Squamous Epithelial 0-5 (*)   ? All other components within normal limits  ?BRAIN NATRIURETIC PEPTIDE - Abnormal; Notable for the following components:  ? B Natriuretic Peptide 199.6 (*)   ? All other components within normal limits  ?I-STAT VENOUS BLOOD GAS, ED - Abnormal; Notable for the following components:  ? pH, Ven 7.155 (*)   ? pCO2, Ven 88.3 (*)   ? pO2, Ven 54 (*)   ? Bicarbonate 31.1 (*)   ? TCO2 34 (*)   ? Potassium 5.9 (*)   ? All other components within normal  limits  ?I-STAT CHEM 8, ED - Abnormal; Notable for the following components:  ? Potassium 5.9 (*)   ? BUN 89 (*)   ? Creatinine, Ser 2.30 (*)   ? Glucose, Bld 112 (*)   ? All other components within normal limits  ?CBG MONITORING,

## 2021-11-10 NOTE — ED Notes (Signed)
ED Provider at bedside. 

## 2021-11-10 NOTE — Progress Notes (Signed)
Patient admitted earlier this morning.  H&P reviewed.  Patient seen and examined. ? ?This is an unfortunate 76 year old female who was recently diagnosed with poorly differentiated neuroendocrine carcinoma which has been rapidly progressing.  Came in with increasing lethargy.  Was noted to be hypotensive.  After discussions with patient's significant other and based on her previously known wishes and in view of her rapidly progressive cancer, she was transitioned over to comfort care. ? ?Patient is unresponsive this morning. ? ?Noted to be hypotensive. ? ?She is comfortable.  Does not appear to be in any discomfort. ?Cold extremities noted. ? 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  Transitioned to comfort care.  Continue comfort care measures.  Anticipate in-hospital death. ? ?Deanna Burns ?2021-11-19 ? ?

## 2021-11-10 NOTE — H&P (Signed)
?History and Physical  ? ? ?[name] EUM:[id number] DOB: [birth year] DOA: 11/28/2021 ? ?PCP: Rusty Aus, MD  ? ?Patient coming from: Home  ? ?Chief Complaint: Unresponsive  ? ?HPI: Deanna Burns is a pleasant 76 y.o. female with medical history significant for COPD, chronic hypoxic and hypercarbic respiratory failure, and recent diagnosis of poorly differentiated neuroendocrine carcinoma, now presenting to the emergency department after her significant other was unable to wake her up.  Patient was recently diagnosed with a highly aggressive and rapidly progressing poorly differentiated carcinoma, was started on chemotherapy on 11/06/2021, and his been increasingly lethargic over the past few days.  She has remained in a recliner for the past couple days, stopped eating or drinking, and became unresponsive last night.  She had not been complaining of chest pain, did not appear to be coughing, and was not choking or vomiting.  EMS was called last night, patient was hypotensive and unresponsive, and she was given 500 cc of saline prior to arrival in the ED. ? ?ED Course: Upon arrival to the ED, patient is found to be hypoxic, tachypneic, slightly tachycardic, and hypotensive.  ED work-up and course is notable for normal brain on head CT, acute renal failure with hyperkalemia, increased LFTs and elevated ammonia, anuria despite aggressive fluid resuscitation, and worsening respiratory acidosis despite BiPAP (note the second blood gas results have not been uploaded to the EMR). ? ?The patient has a child that is estranged and whom she had recently asked to have removed from her emergency contact list.  She has been with her significant other, Merleen Milliner, for over 20 years, and has discussed her end-of-life wishes with him in detail recently.  Patient is aware that her cancer is incurable and aggressive, and had told her partner that she would not want any aggressive measures at the end of her life and would want  to be kept comfortable.  Based on his discussions with her, he believes that she would want [place] to focus on just keeping her comfortable at this juncture. ? ?Review of Systems:  ?Unable to complete ROS secondary to the patient's clinical condition. ? ?Past Medical History:  ?Diagnosis Date  ? Asthma   ? COPD (chronic obstructive pulmonary disease) ([place])   ? Hypertension   ? Nosebleed   ? ? ?Past Surgical History:  ?Procedure Laterality Date  ? ABDOMINAL HYSTERECTOMY    ? BACK SURGERY    ? MOUTH SURGERY Left 08/2020  ? ? ?Social History:  ? reports that she has been smoking cigarettes. She has never used smokeless tobacco. She reports current alcohol use. She reports that she does not use drugs. ? ?Allergies  ?Allergen Reactions  ? Aspirin Swelling  ?  Irritates stomach leads to vomiting   ? Latex   ? Penicillins Swelling  ? Sulfa Antibiotics Swelling  ? ? ?Family History  ?Problem Relation Age of Onset  ? Cancer Mother   ? Diabetes Father   ? ? ? ?Prior to Admission medications   ?Medication Sig Start Date End Date Taking? Authorizing Provider  ?albuterol (PROVENTIL HFA;VENTOLIN HFA) 108 (90 BASE) MCG/ACT inhaler Inhale 2 puffs into the lungs every 6 (six) hours as needed. For shortness of breath.   Yes [provider]  ?ALPRAZolam (XANAX) 0.25 MG tablet Take 0.125 mg by mouth every morning.   Yes [provider]  ?aluminum hydroxide-magnesium carbonate (GAVISCON) 95-358 MG/15ML SUSP Take 15 mLs by mouth as needed for indigestion or heartburn.  Yes [provider]  ?bisoprolol (ZEBETA) 5 MG tablet Take 2.5 mg by mouth daily.    Yes [provider]  ?cycloSPORINE (RESTASIS) 0.05 % ophthalmic emulsion Place 1 drop into both eyes 2 (two) times daily.   Yes [provider]  ?gabapentin (NEURONTIN) 300 MG capsule Take 1 capsule TID to reduce pain. ?Patient taking differently: Take 300 mg by mouth 3 (three) times daily. Take 1 capsule TID to reduce pain. 03/13/20  Yes Eppie Gibson, MD  ?lidocaine (XYLOCAINE) 2 % solution Patient: Mix 1part 2% viscous lidocaine, 1part H20. Swish or apply as needed to mouth, up to QID. 07/25/20  Yes Eppie Gibson, MD  ?magic mouthwash w/lidocaine SOLN 1 part maalox plus (cherry preferred), 1 part nystatin, 1 part benadryl, 3 parts 2% viscous lidocaine. Swish 58mL for 60 seconds, then spit. Use 4-8 times daily. 08/24/20  Yes Eppie Gibson, MD  ?montelukast (SINGULAIR) 10 MG tablet Take 10 mg by mouth at bedtime.   Yes [provider]  ?mucosal barrier oral (GELCLAIR) GEL Mix 1 packet with 59mL water and swish/ gargle for 60 sec. Spit out. Do not eat or drink for 1 hour after. Use up to TID, prn mucosal pain. 03/09/20  Yes Eppie Gibson, MD  ?ondansetron (ZOFRAN) 4 MG tablet Take 4 mg by mouth every 8 (eight) hours as needed for nausea or vomiting. 10/22/21  Yes [provider]  ?ondansetron (ZOFRAN) 8 MG tablet Take 1 tablet (8 mg total) by mouth every 8 (eight) hours as needed for nausea or vomiting. 02/27/20  Yes Eppie Gibson, MD  ?oxyCODONE (OXY IR/ROXICODONE) 5 MG immediate release tablet Take 1 tablet (5 mg total) by mouth every 6 (six) hours as needed for severe pain. 10/31/21  Yes Benay Pike, MD  ?polyethylene glycol (MIRALAX / GLYCOLAX) 17 g packet Take 17 g by mouth daily as needed.   Yes [provider]  ?prochlorperazine (COMPAZINE) 5 MG tablet Take 1 tablet (5 mg total) by mouth every 6 (six) hours as needed for nausea or vomiting. 11/06/21  Yes Benay Pike, MD  ?sodium fluoride (PREVIDENT 5000 PLUS) 1.1 % CREA dental cream Apply to tooth brush. Brush teeth for 2 minutes. Spit out excess-DO NOT swallow. DO NOT rinse afterwards. Repeat nightly. 01/03/20  Yes Lenn Cal, DDS  ?Umeclidinium-Vilanterol (ANORO ELLIPTA IN) Inhale 1 puff into the lungs daily.   Yes [provider]  ?XIIDRA 5 % SOLN Place 1 drop into both eyes daily. 07/02/20  Yes [provider]  ?clindamycin (CLEOCIN) 150 MG  capsule Take 150 mg by mouth 3 (three) times daily. ?Patient not taking: Reported on 11/10/2021 08/21/20   [provider]  ?ibuprofen (ADVIL,MOTRIN) 200 MG tablet Take 200-400 mg by mouth every 6 (six) hours as needed. For pain. ?Patient not taking: Reported on 11/10/2021    [provider]  ?LORazepam (ATIVAN) 0.5 MG tablet Take 1 tablet 30 min before PET scan or Radiotherapy PRN claustrophobia. ?Patient not taking: Reported on 11/10/2021 02/27/20   Eppie Gibson, MD  ? ? ?Physical Exam: ?Vitals:  ? 11/10/21 0518 11/10/21 0530 11/10/21 0545 11/10/21 0600  ?BP: (!) 84/46 (!) 84/46 (!) 83/59 (!) 79/49  ?Pulse: 93 95 91 92  ?Resp: 20 (!) 22 19 (!) 22  ?Temp:      ?TempSrc:      ?SpO2: 98% 97% 98% 97%  ? ? ?Constitutional: No diaphoresis, no pallor  ?Eyes: PERTLA, lids and conjunctivae normal ?ENMT: Mucous membranes are dry. Posterior pharynx  clear of any exudate or lesions.   ?Neck: supple, no masses  ?Respiratory: Diminished breath sounds bilaterally. No crackles.   ?Cardiovascular: S1 & S2 heard, regular rate and rhythm. Pitting edema to bilateral LEs.  ?Abdomen: Soft, no rebound pain or guarding. Bowel sounds active.  ?Musculoskeletal: no clubbing / cyanosis. No joint deformity upper and lower extremities.   ?Skin: no significant rashes, lesions, ulcers. Warm, dry, well-perfused. ?Neurologic: No gross facial asymmetry. Moving all extremities spontaneously. Obtunded.   ? ? ? ?Labs and Imaging on Admission: I have personally reviewed following labs and imaging studies ? ?CBC: ?Recent Labs  ?Lab 11/06/21 ?1146 11/22/2021 ?2345 11/04/2021 ?2351  ?WBC 16.1* 13.9*  --   ?NEUTROABS 14.1* 13.2*  --   ?HGB 13.8 12.7 14.6  14.6  ?HCT 46.1* 45.5 43.0  43.0  ?MCV 100.9* 108.1*  --   ?PLT 301 266  --   ? ?Basic Metabolic Panel: ?Recent Labs  ?Lab 11/06/21 ?1146 11/28/2021 ?2345 11/14/2021 ?2351  ?NA 142 140 141  140  ?K 4.7 6.0* 5.9*  5.9*  ?CL 93* 104 105  ?CO2 37* 28  --   ?GLUCOSE 103* 115* 112*  ?BUN 48* 83* 89*   ?CREATININE 1.17* 1.79* 2.30*  ?CALCIUM 11.5* 9.8  --   ? ?GFR: ?Estimated Creatinine Clearance: 17.2 mL/min (A) (by C-G formula based on SCr of 2.3 mg/dL (H)). ?Liver Function Tests: ?Recent Labs  ?Lab 11/06/21 ?114

## 2021-11-10 NOTE — ED Notes (Signed)
Ralene Bathe MD made aware of lactic 2.3 ?

## 2021-11-10 NOTE — ED Notes (Signed)
Notified MD of hypotension, Dr.Rees at bedside  ?

## 2021-11-11 ENCOUNTER — Inpatient Hospital Stay: Payer: Medicare Other

## 2021-11-11 LAB — URINE CULTURE

## 2021-11-13 ENCOUNTER — Inpatient Hospital Stay: Payer: Medicare Other | Admitting: Hematology and Oncology

## 2021-11-15 LAB — CULTURE, BLOOD (ROUTINE X 2)
Culture: NO GROWTH
Special Requests: ADEQUATE

## 2021-11-26 LAB — POCT I-STAT 7, (LYTES, BLD GAS, ICA,H+H)
Acid-base deficit: 1 mmol/L (ref 0.0–2.0)
Bicarbonate: 31.5 mmol/L — ABNORMAL HIGH (ref 20.0–28.0)
Calcium, Ion: 1.38 mmol/L (ref 1.15–1.40)
HCT: 39 % (ref 36.0–46.0)
Hemoglobin: 13.3 g/dL (ref 12.0–15.0)
O2 Saturation: 92 %
Potassium: 5.4 mmol/L — ABNORMAL HIGH (ref 3.5–5.1)
Sodium: 141 mmol/L (ref 135–145)
TCO2: 35 mmol/L — ABNORMAL HIGH (ref 22–32)
pCO2 arterial: 104.8 mmHg (ref 32–48)
pH, Arterial: 7.085 — CL (ref 7.35–7.45)
pO2, Arterial: 92 mmHg (ref 83–108)

## 2021-11-26 LAB — POCT I-STAT EG7
Acid-base deficit: 6 mmol/L — ABNORMAL HIGH (ref 0.0–2.0)
Bicarbonate: 26.5 mmol/L (ref 20.0–28.0)
Calcium, Ion: 1.24 mmol/L (ref 1.15–1.40)
HCT: 39 % (ref 36.0–46.0)
Hemoglobin: 13.3 g/dL (ref 12.0–15.0)
O2 Saturation: 99 %
Potassium: 5.4 mmol/L — ABNORMAL HIGH (ref 3.5–5.1)
Sodium: 143 mmol/L (ref 135–145)
TCO2: 97.5 mmol/L — ABNORMAL HIGH (ref 22–32)

## 2021-11-27 ENCOUNTER — Inpatient Hospital Stay: Payer: Medicare Other

## 2021-11-27 ENCOUNTER — Inpatient Hospital Stay: Payer: Medicare Other | Admitting: Hematology and Oncology

## 2021-11-28 ENCOUNTER — Inpatient Hospital Stay: Payer: Medicare Other

## 2021-11-29 ENCOUNTER — Inpatient Hospital Stay: Payer: Medicare Other

## 2021-12-03 ENCOUNTER — Inpatient Hospital Stay: Payer: Medicare Other

## 2021-12-05 NOTE — Death Summary Note (Signed)
? ?DEATH SUMMARY  ? ?Patient Details  ?Name: Deanna Burns ?MRN: [id number] ?DOB: [birth year] ?[id number], Christean Grief, MD ?Admission/Discharge Information  ? ?Admit Date:  2021-11-23  ?Date of Death: Date of Death: 25-Nov-2021  ?Time of Death: Time of Death: 0220  ?Length of Stay: 1  ? ?Principle Cause of death: Progressive neuroendocrine carcinoma ? ?Hospital Diagnoses: ?Principal Problem: ?  Acute encephalopathy ?Active Problems: ?  Malignant poorly differentiated neuroendocrine carcinoma ([place]) ?  COPD (chronic obstructive pulmonary disease) ([place]) ?  Acute on chronic respiratory failure with hypoxia and hypercapnia ([facility]) ?  Acute renal failure (ARF) ([facility]) ?  Hyperkalemia ? ? ?Brief HPI: ?76 y.o. female with medical history significant for COPD, chronic hypoxic and hypercarbic respiratory failure, and recent diagnosis of poorly differentiated neuroendocrine carcinoma, now presenting to the emergency department after her significant other was unable to wake her up.  Patient was recently diagnosed with a highly aggressive and rapidly progressing poorly differentiated carcinoma, was started on chemotherapy on 11/06/2021, and his been increasingly lethargic over the past few days.  She has remained in a recliner for the past couple days, stopped eating or drinking, and became unresponsive last night.  She had not been complaining of chest pain, did not appear to be coughing, and was not choking or vomiting.  EMS was called last night, patient was hypotensive and unresponsive, and she was given 500 cc of saline prior to arrival in the ED. ?  ?ED Course: Upon arrival to the ED, patient is found to be hypoxic, tachypneic, slightly tachycardic, and hypotensive.  ED work-up and course is notable for normal brain on head CT, acute renal failure with hyperkalemia, increased LFTs and elevated ammonia, anuria despite aggressive fluid resuscitation, and worsening respiratory acidosis despite BiPAP (note the second blood gas results have  not been uploaded to the EMR). ?  ?The patient has a child that is estranged and whom she had recently asked to have removed from her emergency contact list.  She has been with her significant other, Merleen Milliner, for over 20 years, and has discussed her end-of-life wishes with him in detail recently.  Patient is aware that her cancer is incurable and aggressive, and had told her partner that she would not want any aggressive measures at the end of her life and would want to be kept comfortable.  Based on his discussions with her, he believes that she would want [place] to focus on just keeping her comfortable at this juncture. ? ?Hospital Course: ? ?Acute encephalopathy  ?- Pt has been increasingly lethargic the past few days, stopped eating and drinking, and her partner was unable to wake her last night  ?- Head CT negative in ED; minimal response to Narcan  ?- Multifactorial with contributions from hypercarbia, hyperammonemia, uremia ?- Discussed treatment options with the patient's long-time partner, including positive-pressure ventilation, lactulose via NGT or enema, and continued aggressive fluid resuscitation but based on his recent discussions with the patient, she would not want to pursue these treatments but would want [place] to focus on keeping her comfortable at this juncture  ?- In keeping with the patient's wishes, plan to transition to comfort measures only  ?  ?Acute on chronic hypoxic and hypercarbic respiratory failure; COPD   ?- She was hypoxic on her usual FiO2 and has  acute on chronic hypercarbia  ?- There is question of atelectasis vs PNA in LLL on CXR but she has not been coughing or choking and has not vomited per her  partner  ?- She was on BiPAP briefly in ED but her partner did not believe that she would want that   ?  ?Acute renal failure; hyperkalemia  ?- BUN was 83 and SCr 1.78 on arrival (both normal 9 days earlier); potassium was 6.0  ?- She was given 500 cc IVF with EMS and 2.35 liters in ED  but has only had 25 cc of UOP in the last 7 hrs  ?- As above, her partner believes she would want to transition to comfort care only at this juncture   ?  ?Malignant poorly differentiated neuroendocrine carcinoma  ?- PET from 11/04/21 demonstrated rapidly progressive diffuse hepatic and osseous metastatic disease  ?- Started chemotherapy on 11/06/21 with the understanding that the cancer is very aggressive and incurable  ? ?Patient expired on 11-19-21 at 2:20 AM ? ? ?The results of significant diagnostics from this hospitalization (including imaging, microbiology, ancillary and laboratory) are listed below for reference.  ? ?Significant Diagnostic Studies: ?CT Head Wo Contrast ? ?Result Date: 11/10/2021 ?CLINICAL DATA:  76 year old female with history of altered mental status. EXAM: CT HEAD WITHOUT CONTRAST TECHNIQUE: Contiguous axial images were obtained from the base of the skull through the vertex without intravenous contrast. RADIATION DOSE REDUCTION: This exam was performed according to the departmental dose-optimization program which includes automated exposure control, adjustment of the mA and/or kV according to patient size and/or use of iterative reconstruction technique. COMPARISON:  Head CT 12/22/2019. FINDINGS: Brain: No evidence of acute infarction, hemorrhage, hydrocephalus, extra-axial collection or mass lesion/mass effect. Vascular: No hyperdense vessel or unexpected calcification. Skull: Normal. Negative for fracture or focal lesion. Sinuses/Orbits: No acute finding. Other: None. IMPRESSION: 1. No acute intracranial abnormalities. The appearance of the brain is normal. Electronically Signed   By: Vinnie Langton M.D.   On: 11/10/2021 05:10  ? ?MR Brain W Wo Contrast ? ?Result Date: 11/03/2021 ?CLINICAL DATA:  Small cell lung cancer, staging EXAM: MRI HEAD WITHOUT AND WITH CONTRAST TECHNIQUE: Multiplanar, multiecho pulse sequences of the brain and surrounding structures were obtained without and with  intravenous contrast. CONTRAST:  79mL GADAVIST GADOBUTROL 1 MMOL/ML IV SOLN COMPARISON:  No prior MRI, correlation is made with CT head 12/22/2019 FINDINGS: Evaluation is somewhat limited by motion artifact. Brain: No restricted diffusion to suggest acute or subacute infarct. No acute hemorrhage, mass, mass effect, or midline shift. No hemosiderin deposition to suggest remote hemorrhage. No hydrocephalus or extra-axial collection. No abnormal parenchymal or meningeal enhancement. Lacunar infarct in the left posterior lentiform nucleus scattered T2 hyperintense signal in the periventricular white matter, likely the sequela of mild chronic small vessel ischemic disease. Vascular: Normal flow voids. Skull and upper cervical spine: Normal marrow signal. Sinuses/Orbits: No acute finding. Status post bilateral lens replacements. Other: Fluid in the bilateral mastoid air cells. IMPRESSION: No acute intracranial process. No evidence of metastatic disease in the brain. Electronically Signed   By: Merilyn Baba M.D.   On: 11/03/2021 00:05  ? ?NM PET Image Restag (PS) Skull Base To Thigh ? ?Result Date: 11/04/2021 ?CLINICAL DATA:  Subsequent treatment strategy for small cell lung cancer. EXAM: NUCLEAR MEDICINE PET SKULL BASE TO THIGH TECHNIQUE: 5.4 mCi F-18 FDG was injected intravenously. Full-ring PET imaging was performed from the skull base to thigh after the radiotracer. CT data was obtained and used for attenuation correction and anatomic localization. Fasting blood glucose: 130 mg/dl COMPARISON:  PET-CT 09/12/2021 and 01/12/2020 FINDINGS: Mediastinal blood pool activity: SUV max 1.80 Liver activity: SUV max NA  NECK: 3 cm left nodal neck mass enlarged since prior PET-CT. SUV max is 12.01 and was previously 9.43. New small hypermetabolic lower left neck node has an SUV max of 5.71. Incidental CT findings: Stable advanced coronary artery calcifications. CHEST: No enlarged or hypermetabolic mediastinal hilar lymph nodes. No  metastatic pulmonary nodules. No hypermetabolic breast masses, supraclavicular or axillary adenopathy. Incidental CT findings: Stable advanced atherosclerotic calcification involving the aorta and coronary arter

## 2021-12-05 DEATH — deceased

## 2023-05-11 IMAGING — MR MR HEAD WO/W CM
13 series · 48 of 48 positions shown · IV contrast (gadavist)
Comparison: No prior MRI, correlation is made with CT head
12/22/2019

CLINICAL DATA: Small cell lung cancer, staging

EXAM:
MRI HEAD WITHOUT AND WITH CONTRAST
TECHNIQUE: Multiplanar, multiecho pulse sequences of the brain and surrounding
structures were obtained without and with intravenous contrast.
CONTRAST:  5mL GADAVIST GADOBUTROL 1 MMOL/ML IV SOLN

[Series 2: DWI · axial · 3.0mm · 1.25mm/px · z∈[-43,+106]mm · 8 of 106 slices shown (1 of 4)]
[im 1/106]
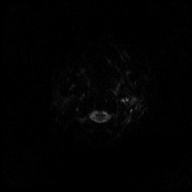
[im 16/106]
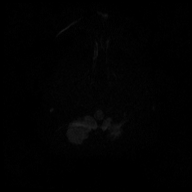
[im 31/106]
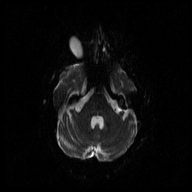
[im 46/106]
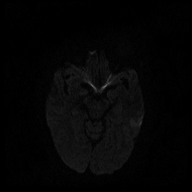
[im 61/106]
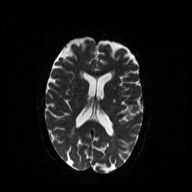
[im 76/106]
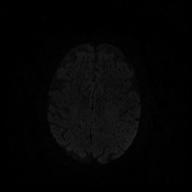
[im 91/106]
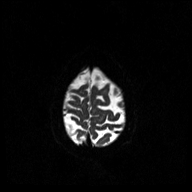
[im 106/106]
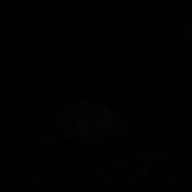

[Series 3: DWI · axial · 3.0mm · 1.25mm/px · z∈[-43,+106]mm · 3 of 53 slices shown (2 of 4)]
[im 1/53]
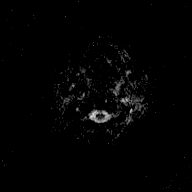
[im 27/53]
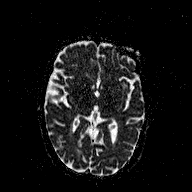
[im 53/53]
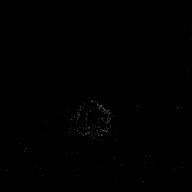

[Series 4: DWI · coronal · 5.0mm · 1.12mm/px · 4 of 59 slices shown (3 of 4)]
[im 1/59]
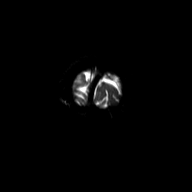
[im 20/59]
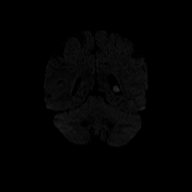
[im 39/59]
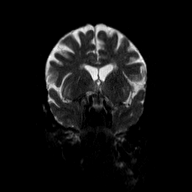
[im 59/59]
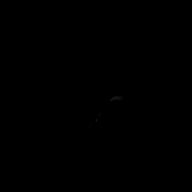

[Series 5: DWI · coronal · 5.0mm · 1.12mm/px · 2 of 30 slices shown (4 of 4)]
[im 1/30]
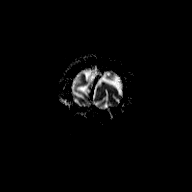
[im 30/30]
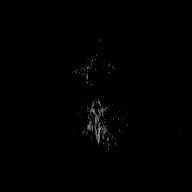

[Series 6: T1 · sagittal · 5.0mm · 0.45mm/px · 1 of 23 slices shown (1 of 2)]
[im 1/23]
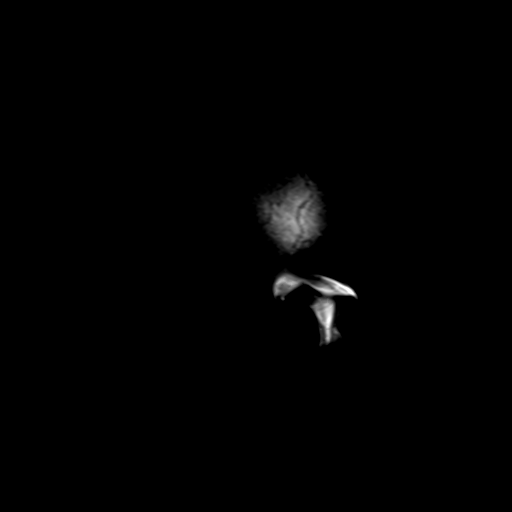

[Series 7: T2 · axial · 5.0mm · 0.69mm/px · 1 of 23 slices shown (1 of 2)]
[im 1/23]
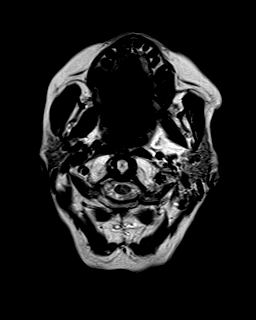

[Series 8: FLAIR · axial · 3.0mm · 0.43mm/px · z∈[-64,+92]mm · 3 of 55 slices shown]
[im 1/55]
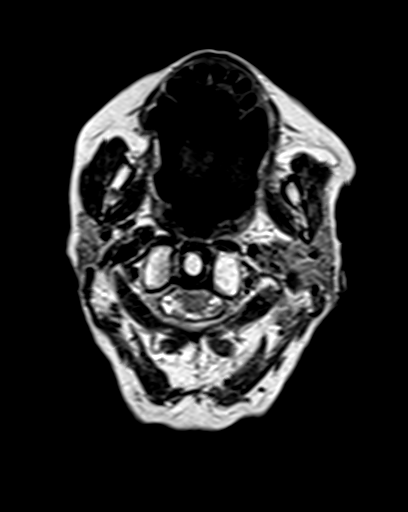
[im 28/55]
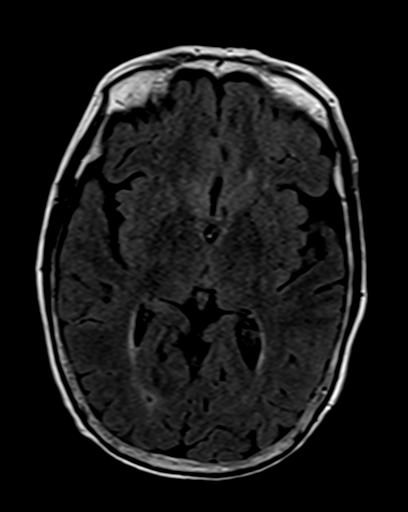
[im 55/55]
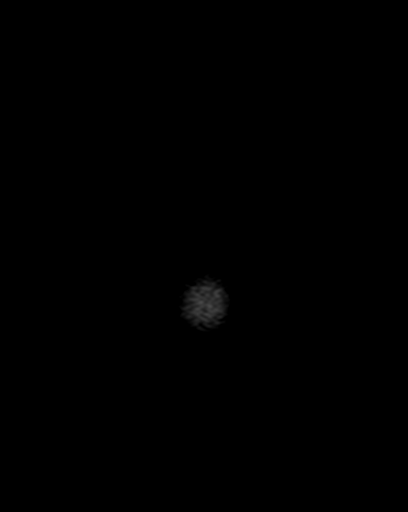

[Series 9: T2 · axial · 5.0mm · 0.69mm/px · 1 of 23 slices shown (2 of 2)]
[im 1/23]
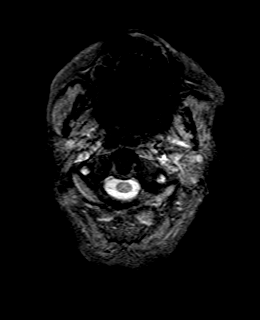

[Series 10: T1 · axial · 1.0mm · 0.86mm/px · z∈[-61,+93]mm · 10 of 160 slices shown (2 of 2)]
[im 1/160]
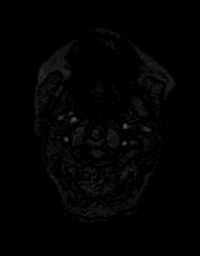
[im 18/160]
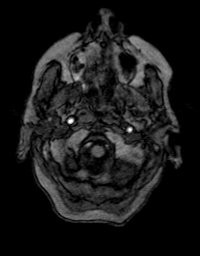
[im 36/160]
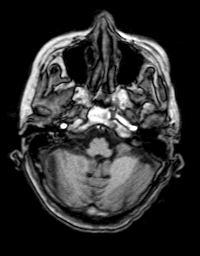
[im 54/160]
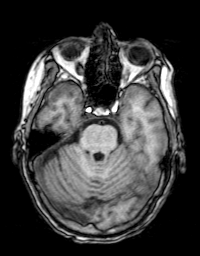
[im 71/160]
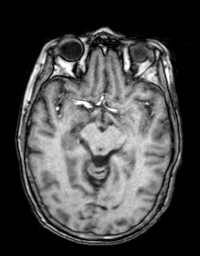
[im 89/160]
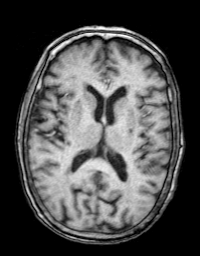
[im 107/160]
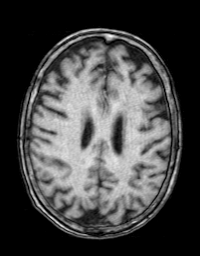
[im 124/160]
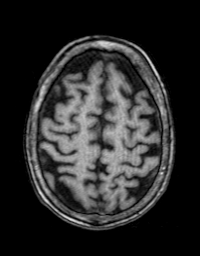
[im 142/160]
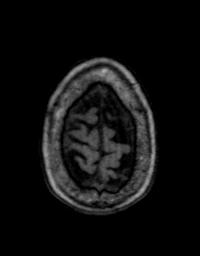
[im 160/160]
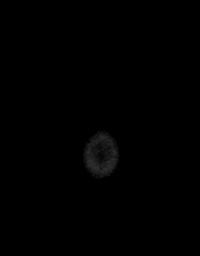

[Series 11: T2 post-contrast · coronal · 5.0mm · 0.43mm/px · 2 of 31 slices shown]
[im 1/31]
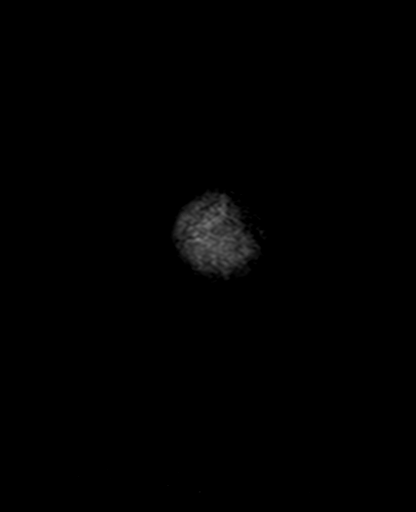
[im 31/31]
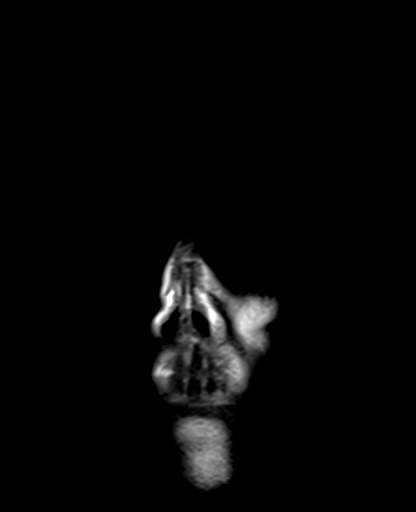

[Series 12: T1 post-contrast · axial · 1.0mm · 0.86mm/px · z∈[-61,+93]mm · 10 of 160 slices shown (1 of 3)]
[im 1/160]
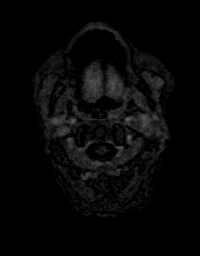
[im 18/160]
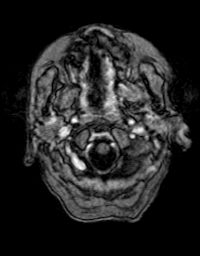
[im 36/160]
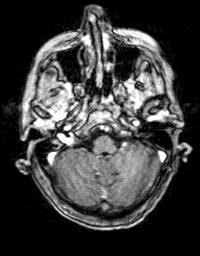
[im 54/160]
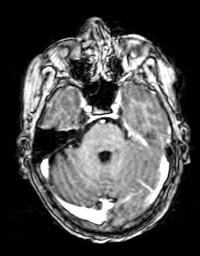
[im 71/160]
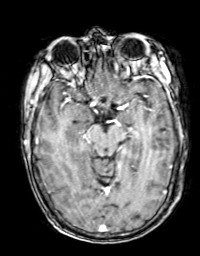
[im 89/160]
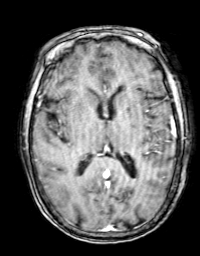
[im 107/160]
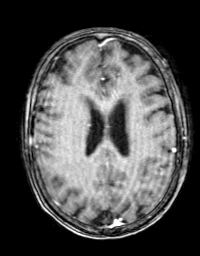
[im 124/160]
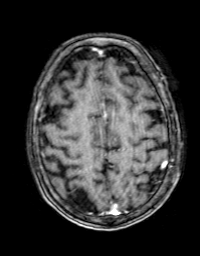
[im 142/160]
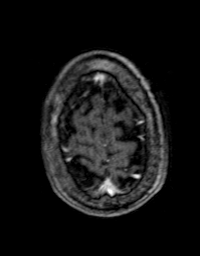
[im 160/160]
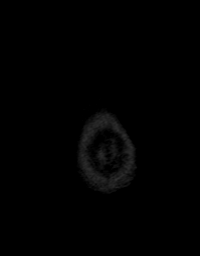

[Series 13: T1 post-contrast · coronal · 5.0mm · 0.43mm/px · 2 of 31 slices shown (2 of 3)]
[im 1/31]
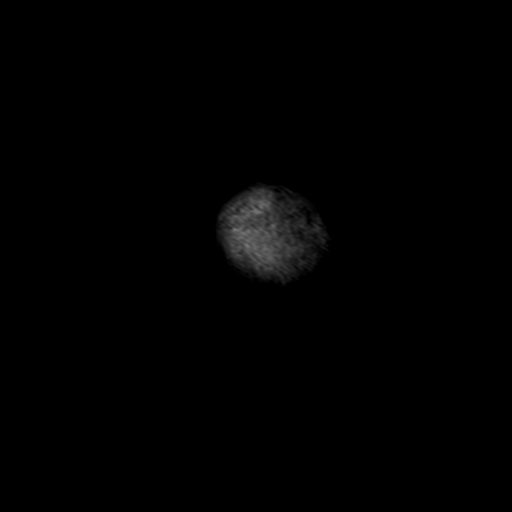
[im 31/31]
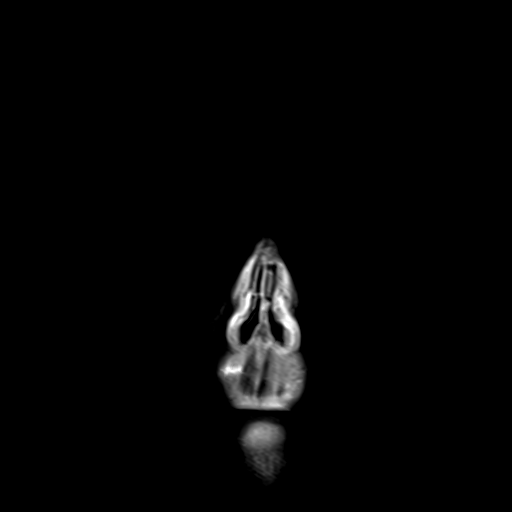

[Series 14: T1 post-contrast · sagittal · 5.0mm · 0.45mm/px · 1 of 23 slices shown (3 of 3)]
[im 1/23]
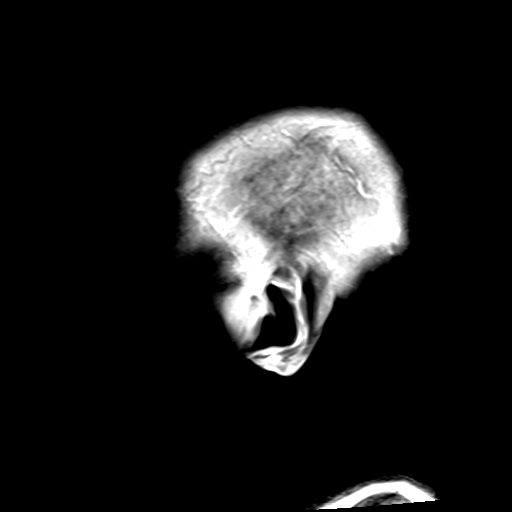

[48 of 48 positions shown; findings below may reference images not displayed]

FINDINGS: Evaluation is somewhat limited by motion artifact.

Brain: No restricted diffusion to suggest acute or subacute infarct.
No acute hemorrhage, mass, mass effect, or midline shift. No
hemosiderin deposition to suggest remote hemorrhage. No
hydrocephalus or extra-axial collection. No abnormal parenchymal or
meningeal enhancement. Lacunar infarct in the left posterior
lentiform nucleus scattered T2 hyperintense signal in the
periventricular white matter, likely the sequela of mild chronic
small vessel ischemic disease.

Vascular: Normal flow voids.

Skull and upper cervical spine: Normal marrow signal.

Sinuses/Orbits: No acute finding. Status post bilateral lens
replacements.

Other: Fluid in the bilateral mastoid air cells.
IMPRESSION: No acute intracranial process. No evidence of metastatic disease in
the brain.
